# Patient Record
Sex: Female | Born: 1985
Health system: Southern US, Community
[De-identification: ages and names within clinical notes are randomized; demographics above are authoritative.]

## PROBLEM LIST (undated history)

## (undated) DIAGNOSIS — B009 Herpesviral infection, unspecified: Secondary | ICD-10-CM

## (undated) DIAGNOSIS — I10 Essential (primary) hypertension: Secondary | ICD-10-CM

## (undated) DIAGNOSIS — K8020 Calculus of gallbladder without cholecystitis without obstruction: Secondary | ICD-10-CM

## (undated) DIAGNOSIS — N390 Urinary tract infection, site not specified: Secondary | ICD-10-CM

## (undated) HISTORY — PX: OTHER SURGICAL HISTORY: SHX169

---
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
## 2004-01-01 ENCOUNTER — Emergency Department (HOSPITAL_COMMUNITY): Admission: EM | Admit: 2004-01-01 | Discharge: 2004-01-01 | Payer: Self-pay | Admitting: Emergency Medicine

## 2005-10-09 ENCOUNTER — Inpatient Hospital Stay (HOSPITAL_COMMUNITY): Admission: AD | Admit: 2005-10-09 | Discharge: 2005-10-09 | Payer: Self-pay | Admitting: Obstetrics & Gynecology

## 2006-01-05 ENCOUNTER — Ambulatory Visit (HOSPITAL_COMMUNITY): Admission: RE | Admit: 2006-01-05 | Discharge: 2006-01-05 | Payer: Self-pay | Admitting: Obstetrics and Gynecology

## 2006-03-23 ENCOUNTER — Emergency Department (HOSPITAL_COMMUNITY): Admission: EM | Admit: 2006-03-23 | Discharge: 2006-03-23 | Payer: Self-pay | Admitting: Emergency Medicine

## 2006-05-20 ENCOUNTER — Inpatient Hospital Stay (HOSPITAL_COMMUNITY): Admission: AD | Admit: 2006-05-20 | Discharge: 2006-05-20 | Payer: Self-pay | Admitting: Gynecology

## 2006-06-11 ENCOUNTER — Inpatient Hospital Stay (HOSPITAL_COMMUNITY): Admission: AD | Admit: 2006-06-11 | Discharge: 2006-06-14 | Payer: Self-pay | Admitting: Obstetrics

## 2008-01-28 ENCOUNTER — Emergency Department (HOSPITAL_BASED_OUTPATIENT_CLINIC_OR_DEPARTMENT_OTHER): Admission: EM | Admit: 2008-01-28 | Discharge: 2008-01-28 | Payer: Self-pay | Admitting: Emergency Medicine

## 2008-11-19 ENCOUNTER — Emergency Department (HOSPITAL_BASED_OUTPATIENT_CLINIC_OR_DEPARTMENT_OTHER): Admission: EM | Admit: 2008-11-19 | Discharge: 2008-11-19 | Payer: Self-pay | Admitting: Emergency Medicine

## 2008-11-26 ENCOUNTER — Ambulatory Visit (HOSPITAL_COMMUNITY): Admission: RE | Admit: 2008-11-26 | Discharge: 2008-11-26 | Payer: Self-pay | Admitting: Obstetrics

## 2008-11-30 ENCOUNTER — Emergency Department (HOSPITAL_BASED_OUTPATIENT_CLINIC_OR_DEPARTMENT_OTHER): Admission: EM | Admit: 2008-11-30 | Discharge: 2008-11-30 | Payer: Self-pay | Admitting: Emergency Medicine

## 2009-04-29 ENCOUNTER — Inpatient Hospital Stay (HOSPITAL_COMMUNITY): Admission: AD | Admit: 2009-04-29 | Discharge: 2009-05-01 | Payer: Self-pay | Admitting: Obstetrics

## 2009-06-17 ENCOUNTER — Ambulatory Visit (HOSPITAL_COMMUNITY): Admission: RE | Admit: 2009-06-17 | Discharge: 2009-06-17 | Payer: Self-pay | Admitting: Obstetrics

## 2010-06-04 ENCOUNTER — Emergency Department (HOSPITAL_BASED_OUTPATIENT_CLINIC_OR_DEPARTMENT_OTHER)
Admission: EM | Admit: 2010-06-04 | Discharge: 2010-06-04 | Payer: Self-pay | Source: Home / Self Care | Admitting: Emergency Medicine

## 2010-06-08 ENCOUNTER — Encounter: Payer: Self-pay | Admitting: Obstetrics

## 2010-06-09 LAB — DIFFERENTIAL
Basophils Relative: 0 % (ref 0–1)
Lymphocytes Relative: 42 % (ref 12–46)
Monocytes Absolute: 0.7 10*3/uL (ref 0.1–1.0)
Monocytes Relative: 9 % (ref 3–12)
Neutro Abs: 3.9 10*3/uL (ref 1.7–7.7)
Neutrophils Relative %: 47 % (ref 43–77)

## 2010-06-09 LAB — URINALYSIS, ROUTINE W REFLEX MICROSCOPIC
Bilirubin Urine: NEGATIVE
Hgb urine dipstick: NEGATIVE
Ketones, ur: NEGATIVE mg/dL
Protein, ur: NEGATIVE mg/dL
Urine Glucose, Fasting: NEGATIVE mg/dL
Urobilinogen, UA: 0.2 mg/dL (ref 0.0–1.0)

## 2010-06-09 LAB — COMPREHENSIVE METABOLIC PANEL
ALT: 15 U/L (ref 0–35)
AST: 24 U/L (ref 0–37)
CO2: 24 mEq/L (ref 19–32)
Calcium: 9.3 mg/dL (ref 8.4–10.5)
Chloride: 106 mEq/L (ref 96–112)
Creatinine, Ser: 0.9 mg/dL (ref 0.4–1.2)
GFR calc Af Amer: >60 mL/min (ref >60)
GFR calc non Af Amer: >60 mL/min (ref >60)
Glucose, Bld: 90 mg/dL (ref 70–99)
Sodium: 142 mEq/L (ref 135–145)
Total Bilirubin: 0.6 mg/dL (ref 0.3–1.2)

## 2010-06-09 LAB — CBC
HCT: 39.5 % (ref 36.0–46.0)
Hemoglobin: 13.5 g/dL (ref 12.0–15.0)
MCH: 28.6 pg (ref 26.0–34.0)
MCHC: 34.2 g/dL (ref 30.0–36.0)
RBC: 4.72 MIL/uL (ref 3.87–5.11)

## 2010-06-09 LAB — LIPASE, BLOOD: Lipase: 85 U/L (ref 23–300)

## 2010-08-19 LAB — CBC
HCT: 28.1 % — ABNORMAL LOW (ref 36.0–46.0)
HCT: 32.3 % — ABNORMAL LOW (ref 36.0–46.0)
Hemoglobin: 9.1 g/dL — ABNORMAL LOW (ref 12.0–15.0)
MCHC: 32.3 g/dL (ref 30.0–36.0)
MCV: 77.2 fL — ABNORMAL LOW (ref 78.0–100.0)
MCV: 77.4 fL — ABNORMAL LOW (ref 78.0–100.0)
Platelets: 285 10*3/uL (ref 150–400)
RBC: 3.63 MIL/uL — ABNORMAL LOW (ref 3.87–5.11)
RDW: 17.5 % — ABNORMAL HIGH (ref 11.5–15.5)
WBC: 12.9 10*3/uL — ABNORMAL HIGH (ref 4.0–10.5)

## 2010-08-19 LAB — RPR: RPR Ser Ql: NONREACTIVE

## 2010-08-19 LAB — URINE CULTURE: Colony Count: NO GROWTH

## 2011-10-29 ENCOUNTER — Emergency Department (HOSPITAL_COMMUNITY)
Admission: EM | Admit: 2011-10-29 | Discharge: 2011-10-29 | Disposition: A | Discharge status: Home / Self Care | Payer: Self-pay | Attending: Emergency Medicine | Admitting: Emergency Medicine

## 2011-10-29 ENCOUNTER — Emergency Department (HOSPITAL_COMMUNITY): Payer: Self-pay

## 2011-10-29 ENCOUNTER — Encounter (HOSPITAL_COMMUNITY): Payer: Self-pay

## 2011-10-29 DIAGNOSIS — R109 Unspecified abdominal pain: Secondary | ICD-10-CM | POA: Insufficient documentation

## 2011-10-29 DIAGNOSIS — N390 Urinary tract infection, site not specified: Secondary | ICD-10-CM | POA: Insufficient documentation

## 2011-10-29 DIAGNOSIS — K8050 Calculus of bile duct without cholangitis or cholecystitis without obstruction: Secondary | ICD-10-CM

## 2011-10-29 DIAGNOSIS — R112 Nausea with vomiting, unspecified: Secondary | ICD-10-CM | POA: Insufficient documentation

## 2011-10-29 DIAGNOSIS — R10819 Abdominal tenderness, unspecified site: Secondary | ICD-10-CM | POA: Insufficient documentation

## 2011-10-29 DIAGNOSIS — K8020 Calculus of gallbladder without cholecystitis without obstruction: Secondary | ICD-10-CM | POA: Insufficient documentation

## 2011-10-29 HISTORY — DX: Calculus of gallbladder without cholecystitis without obstruction: K80.20

## 2011-10-29 LAB — LIPASE, BLOOD: Lipase: 20 U/L (ref 11–59)

## 2011-10-29 LAB — CBC
HCT: 46.2 % — ABNORMAL HIGH (ref 36.0–46.0)
MCH: 30.8 pg (ref 26.0–34.0)
MCV: 88.8 fL (ref 78.0–100.0)
RDW: 13 % (ref 11.5–15.5)
WBC: 13.7 10*3/uL — ABNORMAL HIGH (ref 4.0–10.5)

## 2011-10-29 LAB — URINALYSIS, ROUTINE W REFLEX MICROSCOPIC
Ketones, ur: NEGATIVE mg/dL
Nitrite: NEGATIVE
Protein, ur: NEGATIVE mg/dL
pH: 6.5 (ref 5.0–8.0)

## 2011-10-29 LAB — URINE MICROSCOPIC-ADD ON

## 2011-10-29 LAB — GC/CHLAMYDIA PROBE AMP, GENITAL
Chlamydia, DNA Probe: NEGATIVE
GC Probe Amp, Genital: NEGATIVE

## 2011-10-29 LAB — COMPREHENSIVE METABOLIC PANEL
Albumin: 4.1 g/dL (ref 3.5–5.2)
BUN: 7 mg/dL (ref 6–23)
Calcium: 9.6 mg/dL (ref 8.4–10.5)
Chloride: 100 mEq/L (ref 96–112)
Creatinine, Ser: 0.7 mg/dL (ref 0.50–1.10)
GFR calc non Af Amer: >90 mL/min (ref >90)
Total Bilirubin: 0.5 mg/dL (ref 0.3–1.2)

## 2011-10-29 MED ORDER — DEXTROSE 5 % IV SOLN
1.0000 g | Freq: Once | INTRAVENOUS | Status: AC
  Administered 2011-10-29: 10:00:00 via INTRAVENOUS
  Filled 2011-10-29: qty 10

## 2011-10-29 MED ORDER — OXYCODONE-ACETAMINOPHEN 5-325 MG PO TABS: 1.0000 | ORAL_TABLET | Freq: Four times a day (QID) | ORAL | Status: DC | PRN

## 2011-10-29 MED ORDER — PROMETHAZINE HCL 25 MG/ML IJ SOLN
25.0000 mg | Freq: Once | INTRAMUSCULAR | Status: AC
  Administered 2011-10-29: 25 mg via INTRAMUSCULAR
  Filled 2011-10-29: qty 1

## 2011-10-29 MED ORDER — AZITHROMYCIN 250 MG PO TABS
1000.0000 mg | ORAL_TABLET | Freq: Once | ORAL | Status: AC
  Administered 2011-10-29: 1000 mg via ORAL
  Filled 2011-10-29: qty 4

## 2011-10-29 MED ORDER — METOCLOPRAMIDE HCL 5 MG/ML IJ SOLN
10.0000 mg | Freq: Once | INTRAMUSCULAR | Status: AC
  Administered 2011-10-29: 10 mg via INTRAVENOUS
  Filled 2011-10-29: qty 2

## 2011-10-29 MED ORDER — MORPHINE SULFATE 4 MG/ML IJ SOLN
4.0000 mg | Freq: Once | INTRAMUSCULAR | Status: AC
  Administered 2011-10-29: 4 mg via INTRAVENOUS
  Filled 2011-10-29: qty 1

## 2011-10-29 MED ORDER — ONDANSETRON HCL 4 MG/2ML IJ SOLN
4.0000 mg | Freq: Once | INTRAMUSCULAR | Status: AC
  Administered 2011-10-29: 4 mg via INTRAVENOUS
  Filled 2011-10-29: qty 2

## 2011-10-29 MED ORDER — FENTANYL CITRATE 0.05 MG/ML IJ SOLN
50.0000 ug | Freq: Once | INTRAMUSCULAR | Status: AC
  Administered 2011-10-29: 50 ug via INTRAVENOUS
  Filled 2011-10-29: qty 2

## 2011-10-29 MED ORDER — ONDANSETRON 8 MG PO TBDP: 8.0000 mg | ORAL_TABLET | Freq: Three times a day (TID) | ORAL | Status: DC | PRN

## 2011-10-29 MED ORDER — HYDROMORPHONE HCL PF 1 MG/ML IJ SOLN
1.0000 mg | Freq: Once | INTRAMUSCULAR | Status: AC
  Administered 2011-10-29: 1 mg via INTRAVENOUS
  Filled 2011-10-29: qty 1

## 2011-10-29 MED ORDER — IOHEXOL 300 MG/ML  SOLN
100.0000 mL | Freq: Once | INTRAMUSCULAR | Status: AC | PRN
  Administered 2011-10-29: 100 mL via INTRAVENOUS

## 2011-10-29 MED ORDER — SODIUM CHLORIDE 0.9 % IV SOLN
Freq: Once | INTRAVENOUS | Status: AC
  Administered 2011-10-29: 04:00:00 via INTRAVENOUS

## 2011-10-29 NOTE — ED Provider Notes (Signed)
History     CSN: [id number]  Arrival date & time 10/29/11  4098   First MD Initiated Contact with Patient 10/29/11 0434      Chief Complaint  Patient presents with  . Abdominal Pain   HPI  History provided by the patient. Patient is a 26 year old female with history of obesity and gallstones who presents with complaints of worsening right-sided abdominal pain for the past 3 days. Patient states that pain has been constant with some waxing and waning. At times patient states pain feels similar to prior right upper quadrant pains related to gallstones but at times feels much different. Pain is described as sharp and severe. Patient states that she was diagnosed with gallstones earlier in the year with a scheduled surgery at one time. She will occasionally have some pains off and on over the past few months. Pain usually resolves on its own but the past 2 days has been persistent. Symptoms are associated with some decreased appetite and nausea. Patient denies any fever, chills, sweats, vomiting    Past Medical History  Diagnosis Date  . Gallstones     History reviewed. No pertinent past surgical history.  History reviewed. No pertinent family history.  History  Substance Use Topics  . Smoking status: Current Everyday Smoker -- 0.5 packs/day  . Smokeless tobacco: Not on file  . Alcohol Use: Yes     social    OB History    Grav Para Term Preterm Abortions TAB SAB Ect Mult Living   2 2              Review of Systems  Constitutional: Positive for appetite change. Negative for fever and chills.  Gastrointestinal: Positive for abdominal pain. Negative for nausea, vomiting, diarrhea and constipation.  Genitourinary: Positive for dysuria. Negative for hematuria, flank pain, vaginal bleeding and vaginal discharge.  Skin: Negative for rash.    Allergies  Review of patient's allergies indicates no known allergies.  Home Medications   Current Outpatient Rx  Name Route Sig  Dispense Refill  . IBUPROFEN 200 MG PO TABS Oral Take 400 mg by mouth every 6 (six) hours as needed. Pain    . OXYCODONE-ACETAMINOPHEN 5-325 MG PO TABS Oral Take 1 tablet by mouth every 4 (four) hours as needed. Pain      BP 130/91  Pulse 82  Temp 98.3 F (36.8 C)  Resp 18  Ht 5\' 7"  (1.702 m)  Wt 265 lb (120.203 kg)  BMI 41.50 kg/m2  SpO2 98%  Physical Exam  Nursing note and vitals reviewed. Constitutional: She is oriented to person, place, and time. She appears well-developed and well-nourished. No distress.  HENT:  Head: Normocephalic and atraumatic.  Cardiovascular: Normal rate and regular rhythm.   Pulmonary/Chest: Effort normal and breath sounds normal. No respiratory distress. She has no wheezes. She has no rales.  Abdominal: Soft. There is tenderness in the right upper quadrant and right lower quadrant. There is no rebound and no guarding.       Obese.  Genitourinary: Uterus is not tender. Cervix exhibits no motion tenderness and no friability. Right adnexum displays no mass and no tenderness. Left adnexum displays no mass and no tenderness.       Chaperone was present. Mild cervical discharge. IUD string in place. No erythema or bleeding.  Neurological: She is alert and oriented to person, place, and time.  Skin: Skin is warm and dry. No rash noted.  Psychiatric: She has a normal mood and affect.  Her behavior is normal.    ED Course  Procedures  Results for orders placed during the hospital encounter of 10/29/11  CBC      Component Value Range   WBC 13.7 (*) 4.0 - 10.5 K/uL   RBC 5.20 (*) 3.87 - 5.11 MIL/uL   Hemoglobin 16.0 (*) 12.0 - 15.0 g/dL   HCT 40.9 (*) 81.1 - 91.4 %   MCV 88.8  78.0 - 100.0 fL   MCH 30.8  26.0 - 34.0 pg   MCHC 34.6  30.0 - 36.0 g/dL   RDW 78.2  95.6 - 21.3 %   Platelets 279  150 - 400 K/uL  COMPREHENSIVE METABOLIC PANEL      Component Value Range   Sodium 136  135 - 145 mEq/L   Potassium 3.7  3.5 - 5.1 mEq/L   Chloride 100  96 - 112  mEq/L   CO2 23  19 - 32 mEq/L   Glucose, Bld 89  70 - 99 mg/dL   BUN 7  6 - 23 mg/dL   Creatinine, Ser 0.86  0.50 - 1.10 mg/dL   Calcium 9.6  8.4 - 57.8 mg/dL   Total Protein 8.1  6.0 - 8.3 g/dL   Albumin 4.1  3.5 - 5.2 g/dL   AST 22  0 - 37 U/L   ALT 32  0 - 35 U/L   Alkaline Phosphatase 62  39 - 117 U/L   Total Bilirubin 0.5  0.3 - 1.2 mg/dL   GFR calc non Af Amer >90  >90 mL/min   GFR calc Af Amer >90  >90 mL/min  LIPASE, BLOOD      Component Value Range   Lipase 20  11 - 59 U/L  URINALYSIS, ROUTINE W REFLEX MICROSCOPIC      Component Value Range   Color, Urine YELLOW  YELLOW   APPearance CLOUDY (*) CLEAR   Specific Gravity, Urine 1.018  1.005 - 1.030   pH 6.5  5.0 - 8.0   Glucose, UA NEGATIVE  NEGATIVE mg/dL   Hgb urine dipstick LARGE (*) NEGATIVE   Bilirubin Urine NEGATIVE  NEGATIVE   Ketones, ur NEGATIVE  NEGATIVE mg/dL   Protein, ur NEGATIVE  NEGATIVE mg/dL   Urobilinogen, UA 1.0  0.0 - 1.0 mg/dL   Nitrite NEGATIVE  NEGATIVE   Leukocytes, UA LARGE (*) NEGATIVE  PREGNANCY, URINE      Component Value Range   Preg Test, Ur NEGATIVE  NEGATIVE  URINE MICROSCOPIC-ADD ON      Component Value Range   Squamous Epithelial / LPF MANY (*) RARE   WBC, UA 7-10  <3 WBC/hpf   RBC / HPF 0-2  <3 RBC/hpf   Bacteria, UA FEW (*) RARE       No results found.   No diagnosis found.    MDM  Patient seen and evaluated. Patient no acute distress.    UA has signs of UTI.    Pt discussed in sign out with Dr. Brooke Dare  He will follow CT results.  Angus Seller, [place] 10/29/11 [phone]

## 2011-10-29 NOTE — ED Notes (Signed)
Pt reports RUQ abdominal pain and right flank pain, onset 4 days ago. Pt states that she has hx of kidney stones--- first diagnosed 2 years ago and has had recent recurrence last February--- states that she was supposed to get surgery for it but she did not push through with it because her insurance "ran out". Pt states that symptoms usually disappear in 12 hours but this time, pain is constant and is persistent. Has some nausea, denies vomiting or diarrhea.

## 2011-10-29 NOTE — ED Notes (Signed)
Pt states right side abdominal pain sharp radiating into chest since Monday pt states increase in severity tonight. Pt also c/o nausea denies vomiting. Pt tearful and anxious.

## 2011-10-29 NOTE — ED Provider Notes (Signed)
Medical screening examination/treatment/procedure(s) were performed by non-physician practitioner and as supervising physician I was immediately available for consultation/collaboration.   Sunnie Nielsen, MD 10/29/11 [phone]

## 2011-10-29 NOTE — ED Provider Notes (Addendum)
Assumed care from Dr Dierdre Highman and Ivonne Alexa Golebiewski. Pelvic exam unremarkable. Awaiting CT ap.  If negative will dc home with sx control and provide surgery referral.  Dayton Bailiff, MD 10/29/11 0804  CT negative will order RUQ [place] per CT read recs and still with RUQ pain and leukocytosis  Dayton Bailiff, MD 10/29/11 [phone]

## 2011-10-29 NOTE — ED Notes (Signed)
Patient transported to CT 

## 2011-10-29 NOTE — Discharge Instructions (Signed)
Gallbladder Disease  Gallbladder disease (cholecystitis) is an inflammation of your gallbladder. It is usually caused by a build-up of stones (gallstones) or sludge (cholelithiasis) in your gallbladder. The gallbladder is not an essential organ. It is located slightly to the right of center in the belly (abdomen), behind the liver. It stores bile made in the liver. Bile aids in digestion of fats. Gallbladder disease may result in nausea (feeling sick to your stomach), abdominal pain, and jaundice. In severe cases, emergency surgery may be required.  The most common type of gallbladder disease is gallstones. They begin as small crystals and slowly grow into stones. Gallstone pain occurs when the bile duct has spasms. The spasms are caused by the stone passing out of the duct. The stone is trying to pass at the same time bile is passing into the small bowel for digestion. The pain usually begins suddenly. It may persist from several minutes to several hours. Infection can occur. Infection can add to discomfort and severity of an acute attack. The pain may be made worse by breathing deeply or by being jarred. There may be fever and tenderness to the touch. In some cases, when gallstones do not move into the bile duct, people have no pain or symptoms. These are called "silent" gallstones.  Women are three times more likely to develop gallstones than men. Women who have had several pregnancies are more likely to have gallbladder disease. Physicians sometimes advise removing diseased gallbladders before future pregnancies. Other factors that increase the risk of gallbladder disease are obesity, diets heavy in fried foods and dairy products, increasing age, prolonged use of medications containing female hormones, and heredity.  HOME CARE INSTRUCTIONS    If your physician prescribed an antibiotic, take as directed.   Only take over-the-counter or prescription medicines for pain, discomfort, or fever as directed by your  caregiver.   Follow a low fat diet until seen again. (Fat causes the gallbladder to contract.)   Follow-up as instructed. Attacks are almost always recurrent and surgery is usually required for permanent treatment.  SEEK IMMEDIATE MEDICAL CARE IF:    Pain is increasing and not controlled by medications.   The pain moves to another part of your abdomen or to your back. (Right sided pain can be appendicitis and left sided pain in adults can be diverticulitis).   You have a fever.   You develop nausea and vomiting.  Document Released: 05/04/2005 Document Revised: 04/23/2011 Document Reviewed: 03/20/2011  [employer] Patient Information 2012 [employer].

## 2011-10-29 NOTE — ED Notes (Signed)
Pt. Requesting pain meds at this time. RN made aware.

## 2011-10-31 ENCOUNTER — Inpatient Hospital Stay (HOSPITAL_COMMUNITY): Payer: Medicaid Other | Admitting: Anesthesiology

## 2011-10-31 ENCOUNTER — Inpatient Hospital Stay (HOSPITAL_COMMUNITY): Payer: Medicaid Other

## 2011-10-31 ENCOUNTER — Encounter (HOSPITAL_COMMUNITY): Payer: Self-pay | Admitting: Anesthesiology

## 2011-10-31 ENCOUNTER — Encounter (HOSPITAL_COMMUNITY): Payer: Self-pay | Admitting: Emergency Medicine

## 2011-10-31 ENCOUNTER — Encounter (HOSPITAL_COMMUNITY): Admission: EM | Disposition: A | Discharge status: Home / Self Care | Payer: Self-pay | Source: Home / Self Care

## 2011-10-31 ENCOUNTER — Inpatient Hospital Stay (HOSPITAL_COMMUNITY)
Admission: EM | Admit: 2011-10-31 | Discharge: 2011-11-02 | DRG: 418 | Disposition: A | Discharge status: Home / Self Care | Payer: Medicaid Other | Attending: Surgery | Admitting: Surgery

## 2011-10-31 DIAGNOSIS — K8020 Calculus of gallbladder without cholecystitis without obstruction: Secondary | ICD-10-CM

## 2011-10-31 DIAGNOSIS — K8066 Calculus of gallbladder and bile duct with acute and chronic cholecystitis without obstruction: Secondary | ICD-10-CM

## 2011-10-31 DIAGNOSIS — Z79899 Other long term (current) drug therapy: Secondary | ICD-10-CM

## 2011-10-31 DIAGNOSIS — E669 Obesity, unspecified: Secondary | ICD-10-CM | POA: Diagnosis present

## 2011-10-31 DIAGNOSIS — N390 Urinary tract infection, site not specified: Secondary | ICD-10-CM | POA: Diagnosis present

## 2011-10-31 DIAGNOSIS — K8000 Calculus of gallbladder with acute cholecystitis without obstruction: Principal | ICD-10-CM | POA: Diagnosis present

## 2011-10-31 HISTORY — DX: Urinary tract infection, site not specified: N39.0

## 2011-10-31 HISTORY — PX: CHOLECYSTECTOMY: SHX55

## 2011-10-31 LAB — DIFFERENTIAL
Basophils Absolute: 0 10*3/uL (ref 0.0–0.1)
Eosinophils Relative: 2 % (ref 0–5)
Lymphocytes Relative: 24 % (ref 12–46)
Neutrophils Relative %: 65 % (ref 43–77)

## 2011-10-31 LAB — CBC
MCV: 88.5 fL (ref 78.0–100.0)
Platelets: 264 10*3/uL (ref 150–400)
RBC: 4.85 MIL/uL (ref 3.87–5.11)
RDW: 12.8 % (ref 11.5–15.5)

## 2011-10-31 LAB — BASIC METABOLIC PANEL
CO2: 27 mEq/L (ref 19–32)
Calcium: 9.1 mg/dL (ref 8.4–10.5)
GFR calc non Af Amer: >90 mL/min (ref >90)
Potassium: 3.9 mEq/L (ref 3.5–5.1)
Sodium: 138 mEq/L (ref 135–145)

## 2011-10-31 LAB — LIPASE, BLOOD: Lipase: 22 U/L (ref 11–59)

## 2011-10-31 LAB — HEPATIC FUNCTION PANEL
Albumin: 3.9 g/dL (ref 3.5–5.2)
Alkaline Phosphatase: 59 U/L (ref 39–117)
Total Protein: 7.6 g/dL (ref 6.0–8.3)

## 2011-10-31 SURGERY — LAPAROSCOPIC CHOLECYSTECTOMY WITH INTRAOPERATIVE CHOLANGIOGRAM
Anesthesia: General | Site: Abdomen | Wound class: Contaminated

## 2011-10-31 MED ORDER — DEXTROSE 5 % IV SOLN
2.0000 g | Freq: Once | INTRAVENOUS | Status: AC
  Administered 2011-10-31: 2 g via INTRAVENOUS
  Filled 2011-10-31: qty 2

## 2011-10-31 MED ORDER — LACTATED RINGERS IV SOLN
INTRAVENOUS | Status: DC | PRN
  Administered 2011-10-31: 11:00:00 via INTRAVENOUS

## 2011-10-31 MED ORDER — CIPROFLOXACIN HCL 500 MG PO TABS
500.0000 mg | ORAL_TABLET | Freq: Two times a day (BID) | ORAL | Status: DC
  Administered 2011-10-31 – 2011-11-01 (×2): 500 mg via ORAL
  Filled 2011-10-31 (×4): qty 1

## 2011-10-31 MED ORDER — LIDOCAINE HCL (CARDIAC) 20 MG/ML IV SOLN
INTRAVENOUS | Status: DC | PRN
  Administered 2011-10-31: 100 mg via INTRAVENOUS

## 2011-10-31 MED ORDER — SODIUM CHLORIDE 0.9 % IV SOLN
INTRAVENOUS | Status: DC | PRN
  Administered 2011-10-31: 13:00:00

## 2011-10-31 MED ORDER — ALBUTEROL SULFATE HFA 108 (90 BASE) MCG/ACT IN AERS
INHALATION_SPRAY | RESPIRATORY_TRACT | Status: DC | PRN
  Administered 2011-10-31: 4 via RESPIRATORY_TRACT

## 2011-10-31 MED ORDER — ROCURONIUM BROMIDE 100 MG/10ML IV SOLN
INTRAVENOUS | Status: DC | PRN
  Administered 2011-10-31: 50 mg via INTRAVENOUS

## 2011-10-31 MED ORDER — ONDANSETRON HCL 4 MG/2ML IJ SOLN: 4.0000 mg | Freq: Four times a day (QID) | INTRAMUSCULAR | Status: DC | PRN

## 2011-10-31 MED ORDER — GLYCOPYRROLATE 0.2 MG/ML IJ SOLN
INTRAMUSCULAR | Status: DC | PRN
  Administered 2011-10-31: .8 mg via INTRAVENOUS

## 2011-10-31 MED ORDER — HYDROMORPHONE HCL PF 1 MG/ML IJ SOLN
1.0000 mg | Freq: Once | INTRAMUSCULAR | Status: AC
  Administered 2011-10-31: 1 mg via INTRAVENOUS
  Filled 2011-10-31: qty 1

## 2011-10-31 MED ORDER — SODIUM CHLORIDE 0.9 % IV SOLN
INTRAVENOUS | Status: DC
  Administered 2011-10-31 – 2011-11-01 (×2): via INTRAVENOUS

## 2011-10-31 MED ORDER — HYDROMORPHONE HCL PF 1 MG/ML IJ SOLN
0.2500 mg | INTRAMUSCULAR | Status: DC | PRN
  Administered 2011-10-31 (×4): 0.5 mg via INTRAVENOUS

## 2011-10-31 MED ORDER — LORAZEPAM 2 MG/ML IJ SOLN: 1.0000 mg | Freq: Once | INTRAMUSCULAR | Status: DC | PRN

## 2011-10-31 MED ORDER — FENTANYL CITRATE 0.05 MG/ML IJ SOLN
INTRAMUSCULAR | Status: DC | PRN
  Administered 2011-10-31 (×5): 50 ug via INTRAVENOUS

## 2011-10-31 MED ORDER — ONDANSETRON HCL 4 MG/2ML IJ SOLN
INTRAMUSCULAR | Status: DC | PRN
  Administered 2011-10-31: 4 mg via INTRAVENOUS

## 2011-10-31 MED ORDER — FENTANYL CITRATE 0.05 MG/ML IJ SOLN: 50.0000 ug | INTRAMUSCULAR | Status: DC | PRN

## 2011-10-31 MED ORDER — MORPHINE SULFATE 2 MG/ML IJ SOLN
2.0000 mg | INTRAMUSCULAR | Status: DC | PRN
  Administered 2011-10-31 – 2011-11-01 (×6): 2 mg via INTRAVENOUS
  Filled 2011-10-31 (×6): qty 1

## 2011-10-31 MED ORDER — NEOSTIGMINE METHYLSULFATE 1 MG/ML IJ SOLN
INTRAMUSCULAR | Status: DC | PRN
  Administered 2011-10-31: 5 mg via INTRAVENOUS

## 2011-10-31 MED ORDER — PHENAZOPYRIDINE HCL 100 MG PO TABS
100.0000 mg | ORAL_TABLET | Freq: Three times a day (TID) | ORAL | Status: DC
  Administered 2011-10-31 – 2011-11-02 (×5): 100 mg via ORAL
  Filled 2011-10-31 (×8): qty 1

## 2011-10-31 MED ORDER — DEXTROSE 5 % IV SOLN
2.0000 g | Freq: Four times a day (QID) | INTRAVENOUS | Status: DC
  Filled 2011-10-31 (×3): qty 2

## 2011-10-31 MED ORDER — ONDANSETRON HCL 4 MG/2ML IJ SOLN
4.0000 mg | Freq: Once | INTRAMUSCULAR | Status: AC
  Administered 2011-10-31: 4 mg via INTRAVENOUS
  Filled 2011-10-31: qty 2

## 2011-10-31 MED ORDER — SODIUM CHLORIDE 0.9 % IR SOLN
Status: DC | PRN
  Administered 2011-10-31: 1000 mL

## 2011-10-31 MED ORDER — PHENYLEPHRINE HCL 10 MG/ML IJ SOLN
INTRAMUSCULAR | Status: DC | PRN
  Administered 2011-10-31: 80 ug via INTRAVENOUS

## 2011-10-31 MED ORDER — SODIUM CHLORIDE 0.9 % IV SOLN
INTRAVENOUS | Status: DC
  Administered 2011-10-31: 125 mL/h via INTRAVENOUS

## 2011-10-31 MED ORDER — ENOXAPARIN SODIUM 40 MG/0.4ML ~~LOC~~ SOLN
40.0000 mg | SUBCUTANEOUS | Status: DC
  Administered 2011-11-01: 40 mg via SUBCUTANEOUS
  Filled 2011-10-31 (×3): qty 0.4

## 2011-10-31 MED ORDER — SODIUM CHLORIDE 0.9 % IV SOLN: INTRAVENOUS | Status: DC

## 2011-10-31 MED ORDER — MIDAZOLAM HCL 2 MG/2ML IJ SOLN: 1.0000 mg | INTRAMUSCULAR | Status: DC | PRN

## 2011-10-31 MED ORDER — HYDROMORPHONE HCL PF 1 MG/ML IJ SOLN
INTRAMUSCULAR | Status: AC
  Filled 2011-10-31: qty 1

## 2011-10-31 MED ORDER — BUPIVACAINE-EPINEPHRINE 0.25% -1:200000 IJ SOLN
INTRAMUSCULAR | Status: DC | PRN
  Administered 2011-10-31: 10 mL

## 2011-10-31 MED ORDER — PROPOFOL 10 MG/ML IV EMUL
INTRAVENOUS | Status: DC | PRN
  Administered 2011-10-31: 200 mg via INTRAVENOUS

## 2011-10-31 MED ORDER — MIDAZOLAM HCL 5 MG/5ML IJ SOLN
INTRAMUSCULAR | Status: DC | PRN
  Administered 2011-10-31: 2 mg via INTRAVENOUS

## 2011-10-31 SURGICAL SUPPLY — 41 items
APPLIER CLIP 5 13 M/L LIGAMAX5 (MISCELLANEOUS) ×4
APPLIER CLIP ROT 10 11.4 M/L (STAPLE) ×2
BENZOIN TINCTURE PRP APPL 2/3 (GAUZE/BANDAGES/DRESSINGS) ×2 IMPLANT
BLADE SURG ROTATE 9660 (MISCELLANEOUS) IMPLANT
CANISTER SUCTION 2500CC (MISCELLANEOUS) ×2 IMPLANT
CHLORAPREP W/TINT 26ML (MISCELLANEOUS) ×2 IMPLANT
CLIP APPLIE 5 13 M/L LIGAMAX5 (MISCELLANEOUS) ×2 IMPLANT
CLIP APPLIE ROT 10 11.4 M/L (STAPLE) ×1 IMPLANT
CLOTH BEACON ORANGE TIMEOUT ST (SAFETY) ×2 IMPLANT
COVER MAYO STAND STRL (DRAPES) ×2 IMPLANT
COVER SURGICAL LIGHT HANDLE (MISCELLANEOUS) ×2 IMPLANT
DECANTER SPIKE VIAL GLASS SM (MISCELLANEOUS) ×4 IMPLANT
DRAPE C-ARM 42X72 X-RAY (DRAPES) ×2 IMPLANT
DRAPE UTILITY 15X26 W/TAPE STR (DRAPE) ×4 IMPLANT
DRSG TEGADERM 4X4.75 (GAUZE/BANDAGES/DRESSINGS) ×8 IMPLANT
ELECT REM PT RETURN 9FT ADLT (ELECTROSURGICAL) ×2
ELECTRODE REM PT RTRN 9FT ADLT (ELECTROSURGICAL) ×1 IMPLANT
FILTER SMOKE EVAC LAPAROSHD (FILTER) ×2 IMPLANT
GAUZE SPONGE 2X2 8PLY STRL LF (GAUZE/BANDAGES/DRESSINGS) ×2 IMPLANT
GLOVE BIO SURGEON STRL SZ7 (GLOVE) ×2 IMPLANT
GLOVE BIOGEL PI IND STRL 7.5 (GLOVE) ×1 IMPLANT
GLOVE BIOGEL PI INDICATOR 7.5 (GLOVE) ×1
GOWN STRL NON-REIN LRG LVL3 (GOWN DISPOSABLE) ×8 IMPLANT
KIT BASIN OR (CUSTOM PROCEDURE TRAY) ×2 IMPLANT
KIT ROOM TURNOVER OR (KITS) ×2 IMPLANT
NS IRRIG 1000ML POUR BTL (IV SOLUTION) ×2 IMPLANT
PAD ARMBOARD 7.5X6 YLW CONV (MISCELLANEOUS) ×2 IMPLANT
POUCH SPECIMEN RETRIEVAL 10MM (ENDOMECHANICALS) ×2 IMPLANT
SCISSORS LAP 5X35 DISP (ENDOMECHANICALS) IMPLANT
SET CHOLANGIOGRAPH 5 50 .035 (SET/KITS/TRAYS/PACK) ×2 IMPLANT
SET IRRIG TUBING LAPAROSCOPIC (IRRIGATION / IRRIGATOR) ×2 IMPLANT
SLEEVE ENDOPATH XCEL 5M (ENDOMECHANICALS) ×2 IMPLANT
SPECIMEN JAR SMALL (MISCELLANEOUS) ×2 IMPLANT
SPONGE GAUZE 2X2 STER 10/PKG (GAUZE/BANDAGES/DRESSINGS) ×2
SUT MNCRL AB 4-0 PS2 18 (SUTURE) ×2 IMPLANT
TOWEL OR 17X24 6PK STRL BLUE (TOWEL DISPOSABLE) ×2 IMPLANT
TOWEL OR 17X26 10 PK STRL BLUE (TOWEL DISPOSABLE) ×2 IMPLANT
TRAY LAPAROSCOPIC (CUSTOM PROCEDURE TRAY) ×2 IMPLANT
TROCAR XCEL BLUNT TIP 100MML (ENDOMECHANICALS) ×2 IMPLANT
TROCAR XCEL NON-BLD 11X100MML (ENDOMECHANICALS) ×2 IMPLANT
TROCAR XCEL NON-BLD 5MMX100MML (ENDOMECHANICALS) ×2 IMPLANT

## 2011-10-31 NOTE — Anesthesia Postprocedure Evaluation (Signed)
  Anesthesia Post-op Note  Patient: Amanda Nunez  Procedure(s) Performed: Procedure(s) (LRB): LAPAROSCOPIC CHOLECYSTECTOMY WITH INTRAOPERATIVE CHOLANGIOGRAM (N/A)  Patient Location: PACU  Anesthesia Type: General  Level of Consciousness: awake  Airway and Oxygen Therapy: Patient Spontanous Breathing  Post-op Pain: mild  Post-op Assessment: Post-op Vital signs reviewed, Patient's Cardiovascular Status Stable, Respiratory Function Stable, Patent Airway, No signs of Nausea or vomiting and Pain level controlled  Post-op Vital Signs: stable  Complications: No apparent anesthesia complications

## 2011-10-31 NOTE — Op Note (Signed)
Laparoscopic Cholecystectomy with IOC Procedure Note  Indications: This patient presents with symptomatic gallbladder disease and will undergo laparoscopic cholecystectomy.  Pre-operative Diagnosis: Calculus of gallbladder with acute cholecystitis, without mention of obstruction  Post-operative Diagnosis: Same  Surgeon: Mele Sylvester K.   Assistants: none  Anesthesia: General endotracheal anesthesia  ASA Class: 2E  Procedure Details  The patient was seen again in the Holding Room. The risks, benefits, complications, treatment options, and expected outcomes were discussed with the patient. The possibilities of reaction to medication, pulmonary aspiration, perforation of viscus, bleeding, recurrent infection, finding a normal gallbladder, the need for additional procedures, failure to diagnose a condition, the possible need to convert to an open procedure, and creating a complication requiring transfusion or operation were discussed with the patient. The likelihood of improving the patient's symptoms with return to their baseline status is good.  The patient and/or family concurred with the proposed plan, giving informed consent. The site of surgery properly noted. The patient was taken to Operating Room, identified as Amanda Nunez and the procedure verified as Laparoscopic Cholecystectomy with Intraoperative Cholangiogram. A Time Out was held and the above information confirmed.  Prior to the induction of general anesthesia, antibiotic prophylaxis was administered. General endotracheal anesthesia was then administered and tolerated well. After the induction, the abdomen was prepped with Chloraprep and draped in the sterile fashion. The patient was positioned in the supine position.  Local anesthetic agent was injected into the skin near the umbilicus and an incision made. We dissected down to the abdominal fascia with blunt dissection.  The fascia was incised vertically and we entered the  peritoneal cavity bluntly.  A pursestring suture of 0-Vicryl was placed around the fascial opening.  The Hasson cannula was inserted and secured with the stay suture.  Pneumoperitoneum was then created with CO2 and tolerated well without any adverse changes in the patient's vital signs. An 11-mm port was placed in the subxiphoid position.  Two 5-mm ports were placed in the right upper quadrant. All skin incisions were infiltrated with a local anesthetic agent before making the incision and placing the trocars.   We positioned the patient in reverse Trendelenburg, tilted slightly to the patient's left.  The gallbladder was identified, and was noted to be very edematous and thickened.  We decompressed the gallbladder with the suction device, then  the fundus was grasped and retracted cephalad. Adhesions were lysed bluntly and with the electrocautery where indicated, taking care not to injure any adjacent organs or viscus. The infundibulum was grasped and retracted laterally, exposing the peritoneum overlying the triangle of Calot. This was then divided and exposed in a blunt fashion. A critical view of the cystic duct and cystic artery was obtained.  The cystic duct was clearly identified and bluntly dissected circumferentially. The cystic duct was ligated with a clip distally.   An incision was made in the cystic duct and the [facility] cholangiogram catheter introduced. The catheter was secured using a clip. A cholangiogram was then obtained which showed good visualization of the distal and proximal biliary tree with no sign of filling defects or obstruction.  Contrast flowed easily into the duodenum. The catheter was then removed.   The cystic duct was then ligated with clips and divided. The cystic artery was identified, dissected free, ligated with clips and divided as well.   The gallbladder was dissected from the liver bed in retrograde fashion with the electrocautery.  This was quite difficult due to the size  of the gallbladder  and the thickened wall. The gallbladder was removed and placed in an Endocatch sac. The liver bed was irrigated and inspected. Hemostasis was achieved with the electrocautery. Copious irrigation was utilized and was repeatedly aspirated until clear.  The gallbladder and Endocatch sac were then removed through the umbilical port site.  The pursestring suture was used to close the umbilical fascia.    We again inspected the right upper quadrant for hemostasis.  Pneumoperitoneum was released as we removed the trocars.  4-0 Monocryl was used to close the skin.   Benzoin, steri-strips, and clean dressings were applied. The patient was then extubated and brought to the recovery room in stable condition. Instrument, sponge, and needle counts were correct at closure and at the conclusion of the case.   Findings: Cholecystitis with Cholelithiasis  Estimated Blood Loss: less than 50 mL         Drains: none          Specimens: Gallbladder           Complications: None; patient tolerated the procedure well.         Disposition: PACU - hemodynamically stable.         Condition: stable  Wilmon Arms. Corliss Skains, MD, [facility]  10/31/2011 2:16 PM

## 2011-10-31 NOTE — Transfer of Care (Signed)
Immediate Anesthesia Transfer of Care Note  Patient: Amanda Nunez  Procedure(s) Performed: Procedure(s) (LRB): LAPAROSCOPIC CHOLECYSTECTOMY WITH INTRAOPERATIVE CHOLANGIOGRAM (N/A)  Patient Location: PACU  Anesthesia Type: General  Level of Consciousness: awake, alert  and oriented  Airway & Oxygen Therapy: Patient Spontanous Breathing and Patient connected to nasal cannula oxygen  Post-op Assessment: Report given to PACU RN and Post -op Vital signs reviewed and stable  Post vital signs: Reviewed and stable  Complications: No apparent anesthesia complications

## 2011-10-31 NOTE — ED Notes (Addendum)
Trauma MD [place] at bedside to discuss plan of care. Pt ambulated to bathroom. Pt complaining of 6/10 pain, pt aware of NPO status.

## 2011-10-31 NOTE — Preoperative (Signed)
Beta Blockers   Reason not to administer Beta Blockers:Not Applicable 

## 2011-10-31 NOTE — ED Provider Notes (Signed)
History     CSN: [id number]  Arrival date & time 10/31/11  0235   First MD Initiated Contact with Patient 10/31/11 0335      Chief Complaint  Patient presents with  . Abdominal Pain    (Consider location/radiation/quality/duration/timing/severity/associated sxs/prior treatment) HPI History provided by patient. Abdominal pain located right upper quadrant. Sharp in quality without radiation. Worse with anything she tries to eat or drink. Has long-standing history of gallstones and was previously scheduled to see a surgeon in February but did not have insurance or means to pay for elective surgery. No fevers or chills. Some nausea no vomiting.  No diarrhea. Patient evaluated 2 days ago at [facility] long emergency department had a CT scan demonstrating gallstones and an ultrasound demonstrating gallstones and sludge but no cholecystitis. Patient was given narcotic pain medications which she has been taking at home without relief of symptoms. She presents tonight requesting to have her gallbladder removed. Moderate in severity. No known alleviating factors. Past Medical History  Diagnosis Date  . Gallstones   . UTI (lower urinary tract infection)     History reviewed. No pertinent past surgical history.  No family history on file.  History  Substance Use Topics  . Smoking status: Current Everyday Smoker -- 0.5 packs/day  . Smokeless tobacco: Not on file  . Alcohol Use: Yes     social    OB History    Grav Para Term Preterm Abortions TAB SAB Ect Mult Living   2 2              Review of Systems  Constitutional: Negative for fever and chills.  HENT: Negative for neck pain and neck stiffness.   Eyes: Negative for pain.  Respiratory: Negative for shortness of breath.   Cardiovascular: Negative for chest pain.  Gastrointestinal: Positive for abdominal pain. Negative for diarrhea, constipation and blood in stool.  Genitourinary: Negative for dysuria.  Musculoskeletal: Negative for  back pain.  Skin: Negative for rash.  Neurological: Negative for headaches.  All other systems reviewed and are negative.    Allergies  Review of patient's allergies indicates no known allergies.  Home Medications   Current Outpatient Rx  Name Route Sig Dispense Refill  . LEVONORGESTREL 20 MCG/24HR IU IUD Intrauterine 1 each by Intrauterine route once.    . OXYCODONE-ACETAMINOPHEN 5-325 MG PO TABS Oral Take 1 tablet by mouth every 4 (four) hours as needed. Pain      BP 118/82  Pulse 82  Temp 98.8 F (37.1 C) (Oral)  Resp 18  SpO2 98%  LMP 09/28/2011  Physical Exam  Constitutional: She is oriented to person, place, and time. She appears well-developed and well-nourished.  HENT:  Head: Normocephalic and atraumatic.  Eyes: Conjunctivae and EOM are normal. Pupils are equal, round, and reactive to light.  Neck: Trachea normal. Neck supple. No thyromegaly present.  Cardiovascular: Normal rate, regular rhythm, S1 normal, S2 normal and normal pulses.     No systolic murmur is present   No diastolic murmur is present  Pulses:      Radial pulses are 2+ on the right side, and 2+ on the left side.  Pulmonary/Chest: Effort normal and breath sounds normal. She has no wheezes. She has no rhonchi. She has no rales. She exhibits no tenderness.  Abdominal: Soft. Normal appearance and bowel sounds are normal. There is no rebound, no guarding, no CVA tenderness and negative Murphy's sign.       Tender right upper quadrant. No  abnormal tenderness otherwise. Exam somewhat limited by body habitus  Musculoskeletal:       BLE:s Calves nontender, no cords or erythema, negative Homans sign  Neurological: She is alert and oriented to person, place, and time. She has normal strength. No cranial nerve deficit or sensory deficit. GCS eye subscore is 4. GCS verbal subscore is 5. GCS motor subscore is 6.  Skin: Skin is warm and dry. No rash noted. She is not diaphoretic.  Psychiatric: Her speech is  normal.       Cooperative and appropriate    ED Course  Procedures (including critical care time)  Labs Reviewed  CBC - Abnormal; Notable for the following:    Hemoglobin 15.2 (*)     All other components within normal limits  DIFFERENTIAL  BASIC METABOLIC PANEL  HEPATIC FUNCTION PANEL  LIPASE, BLOOD   US Abdomen Complete  10/29/2011  *RADIOLOGY REPORT*  Clinical Data:  Abdominal pain.  COMPLETE ABDOMINAL ULTRASOUND  Comparison:  CT abdomen and pelvis 10/29/2011 at 8:54 a.m.  Findings:  Gallbladder:  Multiple stones are seen in the gallbladder and there is gallbladder sludge.  No wall thickening or pericholecystic fluid.  Sonographer reports negative Murphy's sign.  Common bile duct:  Measures  Liver:  No focal lesion identified.  Within normal limits in parenchymal echogenicity.  IVC:  Appears normal.  Pancreas:  No focal abnormality seen.  Spleen:  Measures 7.9 cm and appears normal.  Right Kidney:  Measures 12.1 cm and appears normal.  Left Kidney:  Measures 12.3 cm and appears normal.  Abdominal aorta:  No aneurysm identified.  IMPRESSION: Multiple gallstones with gallbladder sludge.  No evidence of cholecystitis.  Original Report Authenticated By: Bernadene Bell. Maricela Curet, M.D.   Ct Abdomen Pelvis W Contrast  10/29/2011  *RADIOLOGY REPORT*  Clinical Data: Right lower quadrant pain with nausea and vomiting.  CT ABDOMEN AND PELVIS WITH CONTRAST  Technique:  Multidetector CT imaging of the abdomen and pelvis was performed following the standard protocol during bolus administration of intravenous contrast.  Contrast: OMNIPAQUE IOHEXOL 300 MG/ML  SOLN  Comparison: None.  Findings: Lung bases show minimal dependent atelectasis.  Heart size normal.  No pericardial or pleural effusion.  Liver is unremarkable.  Several stones are seen in the gallbladder. Question mild gallbladder wall thickening.  No inflammatory changes.  Adrenal glands, kidneys, spleen, pancreas, stomach and bowel, including  appendix, are unremarkable.  Small pelvic free fluid.  An intrauterine contraceptive device is seen in the lower uterine segment, rather than the expected location of the fundus.  Ovaries are visualized.  No pathologically enlarged lymph nodes.  No worrisome lytic or sclerotic lesions.  IMPRESSION:  1.  Cholelithiasis.  Question gallbladder wall thickening. Ultrasound limited right upper quadrant may be helpful in further evaluation, as clinically indicated. 2.  No evidence of acute appendicitis. 3.  Intrauterine contraceptive device is located in the lower uterine segment, rather than the expected location of the uterine fundus. 4.  Small pelvic free fluid.  Original Report Authenticated By: Reyes Ivan, M.D.   5:37 AM d/w [facility] Dr Dwain Sarna, will eval in ED  IV dilaudid pain control. Requiring repeat Dilaudid. N.p.o. IV fluids. MDM   Right upper quadrant abdominal pain with known gallstones.  Nursing notes reviewed. Vital signs reviewed. Old records reviewed as above. Labs obtained today and reviewed as above. No leukocytosis. No elevated LFTs. Presentation suggests persistent pain due to gallstones. General surgery consult for ED evaluation as above.  Plan admit  general surgery        Sunnie Nielsen, MD 10/31/11 [phone]

## 2011-10-31 NOTE — H&P (Signed)
Amanda Nunez is an 26 y.o. female.   Chief Complaint: abdominal pain consult from Dr. Dierdre Highman HPI:  26 yo obese female who presents with several years of known gallstones and occasional symptoms.  She has had more frequent episodes of ruq pain lately.  For the last week her ruq has been painful and made worse with eating.  She was seen in er a couple days ago and given percocet but this has not helped.  She has one episode of emesis.  She denies fevers.  She comes back in today due to persistence of ruq pain and inability to eat.  Past Medical History  Diagnosis Date  . Gallstones   . UTI (lower urinary tract infection)     History reviewed. No pertinent past surgical history.  No family history on file. Social History:  reports that she has been smoking.  She does not have any smokeless tobacco history on file. She reports that she drinks alcohol. She reports that she does not use illicit drugs.  Allergies: No Known Allergies Meds: none  Results for orders placed during the hospital encounter of 10/31/11 (from the past 48 hour(s))  CBC     Status: Abnormal   Collection Time   10/31/11  2:45 AM      Component Value Range Comment   WBC 10.4  4.0 - 10.5 K/uL    RBC 4.85  3.87 - 5.11 MIL/uL    Hemoglobin 15.2 (*) 12.0 - 15.0 g/dL    HCT 91.4  78.2 - 95.6 %    MCV 88.5  78.0 - 100.0 fL    MCH 31.3  26.0 - 34.0 pg    MCHC 35.4  30.0 - 36.0 g/dL    RDW 21.3  08.6 - 57.8 %    Platelets 264  150 - 400 K/uL   DIFFERENTIAL     Status: Normal   Collection Time   10/31/11  2:45 AM      Component Value Range Comment   Neutrophils Relative 65  43 - 77 %    Neutro Abs 6.8  1.7 - 7.7 K/uL    Lymphocytes Relative 24  12 - 46 %    Lymphs Abs 2.5  0.7 - 4.0 K/uL    Monocytes Relative 9  3 - 12 %    Monocytes Absolute 1.0  0.1 - 1.0 K/uL    Eosinophils Relative 2  0 - 5 %    Eosinophils Absolute 0.2  0.0 - 0.7 K/uL    Basophils Relative 0  0 - 1 %    Basophils Absolute 0.0  0.0 - 0.1 K/uL     BASIC METABOLIC PANEL     Status: Normal   Collection Time   10/31/11  2:45 AM      Component Value Range Comment   Sodium 138  135 - 145 mEq/L    Potassium 3.9  3.5 - 5.1 mEq/L    Chloride 101  96 - 112 mEq/L    CO2 27  19 - 32 mEq/L    Glucose, Bld 94  70 - 99 mg/dL    BUN 7  6 - 23 mg/dL    Creatinine, Ser 4.69  0.50 - 1.10 mg/dL    Calcium 9.1  8.4 - 62.9 mg/dL    GFR calc non Af Amer >90  >90 mL/min    GFR calc Af Amer >90  >90 mL/min   HEPATIC FUNCTION PANEL     Status: Normal  Collection Time   10/31/11  2:45 AM      Component Value Range Comment   Total Protein 7.6  6.0 - 8.3 g/dL    Albumin 3.9  3.5 - 5.2 g/dL    AST 21  0 - 37 U/L    ALT 23  0 - 35 U/L    Alkaline Phosphatase 59  39 - 117 U/L    Total Bilirubin 0.5  0.3 - 1.2 mg/dL    Bilirubin, Direct 0.1  0.0 - 0.3 mg/dL    Indirect Bilirubin 0.4  0.3 - 0.9 mg/dL   LIPASE, BLOOD     Status: Normal   Collection Time   10/31/11  2:45 AM      Component Value Range Comment   Lipase 22  11 - 59 U/L    US Abdomen Complete  10/29/2011  *RADIOLOGY REPORT*  Clinical Data:  Abdominal pain.  COMPLETE ABDOMINAL ULTRASOUND  Comparison:  CT abdomen and pelvis 10/29/2011 at 8:54 a.m.  Findings:  Gallbladder:  Multiple stones are seen in the gallbladder and there is gallbladder sludge.  No wall thickening or pericholecystic fluid.  Sonographer reports negative Murphy's sign.  Common bile duct:  Measures  Liver:  No focal lesion identified.  Within normal limits in parenchymal echogenicity.  IVC:  Appears normal.  Pancreas:  No focal abnormality seen.  Spleen:  Measures 7.9 cm and appears normal.  Right Kidney:  Measures 12.1 cm and appears normal.  Left Kidney:  Measures 12.3 cm and appears normal.  Abdominal aorta:  No aneurysm identified.  IMPRESSION: Multiple gallstones with gallbladder sludge.  No evidence of cholecystitis.  Original Report Authenticated By: Bernadene Bell. Maricela Curet, M.D.   Ct Abdomen Pelvis W Contrast  10/29/2011   *RADIOLOGY REPORT*  Clinical Data: Right lower quadrant pain with nausea and vomiting.  CT ABDOMEN AND PELVIS WITH CONTRAST  Technique:  Multidetector CT imaging of the abdomen and pelvis was performed following the standard protocol during bolus administration of intravenous contrast.  Contrast: OMNIPAQUE IOHEXOL 300 MG/ML  SOLN  Comparison: None.  Findings: Lung bases show minimal dependent atelectasis.  Heart size normal.  No pericardial or pleural effusion.  Liver is unremarkable.  Several stones are seen in the gallbladder. Question mild gallbladder wall thickening.  No inflammatory changes.  Adrenal glands, kidneys, spleen, pancreas, stomach and bowel, including appendix, are unremarkable.  Small pelvic free fluid.  An intrauterine contraceptive device is seen in the lower uterine segment, rather than the expected location of the fundus.  Ovaries are visualized.  No pathologically enlarged lymph nodes.  No worrisome lytic or sclerotic lesions.  IMPRESSION:  1.  Cholelithiasis.  Question gallbladder wall thickening. Ultrasound limited right upper quadrant may be helpful in further evaluation, as clinically indicated. 2.  No evidence of acute appendicitis. 3.  Intrauterine contraceptive device is located in the lower uterine segment, rather than the expected location of the uterine fundus. 4.  Small pelvic free fluid.  Original Report Authenticated By: Reyes Ivan, M.D.    ROS  Blood pressure 120/82, pulse 79, temperature 98.4 F (36.9 C), temperature source Oral, resp. rate 18, last menstrual period 09/28/2011, SpO2 99.00%. Physical Exam  Vitals reviewed. Constitutional: She appears well-developed and well-nourished.  Eyes: No scleral icterus.  Cardiovascular: Normal rate, regular rhythm and normal heart sounds.   Respiratory: Effort normal and breath sounds normal. She has no wheezes. She has no rales.  GI: Soft. Bowel sounds are normal. There is tenderness in the  right upper quadrant.  There is negative Murphy's sign. No hernia.     Assessment/Plan Symptomatic cholelithiasis I recommended admission and cholecystectomy due to pain.  I discussed surgery and told them my partner Dr. Corliss Skains would see her this am and take care of her today.   Amanda Nunez 10/31/2011, 6:52 AM

## 2011-10-31 NOTE — ED Notes (Signed)
Pt states that she went to [facility] on Thursday with similar pain. Pt states she was scanned and given pain medication to go home with pt denies relief from abdmonial pain that is right upper quadrant. Pt states she has a hx of gallstones and was suppose to have surgery in Feb but cancelled. Pt states pain started after she ate 5 days ago and eating makes the pain worse.

## 2011-10-31 NOTE — Anesthesia Preprocedure Evaluation (Addendum)
Anesthesia Evaluation  Patient identified by MRN, date of birth, ID band Patient awake    Reviewed: Allergy & Precautions, H&P , NPO status , Patient's Chart, lab work & pertinent test results  Airway Mallampati: II TM Distance: >3 FB Neck ROM: Full    Dental  (+) Dental Advisory Given and Teeth Intact   Pulmonary COPDCurrent Smoker,  + rhonchi         Cardiovascular negative cardio ROS      Neuro/Psych negative neurological ROS  negative psych ROS   GI/Hepatic negative GI ROS, Neg liver ROS,   Endo/Other  Morbid obesity  Renal/GU negative Renal ROS  negative genitourinary   Musculoskeletal negative musculoskeletal ROS (+)   Abdominal (+) + obese,   Peds negative pediatric ROS (+)  Hematology negative hematology ROS (+)   Anesthesia Other Findings   Reproductive/Obstetrics negative OB ROS                         Anesthesia Physical Anesthesia Plan  ASA: III and Emergent  Anesthesia Plan: General   Post-op Pain Management:    Induction: Intravenous  Airway Management Planned: Oral ETT  Additional Equipment:   Intra-op Plan:   Post-operative Plan: Extubation in OR  Informed Consent: I have reviewed the patients History and Physical, chart, labs and discussed the procedure including the risks, benefits and alternatives for the proposed anesthesia with the patient or authorized representative who has indicated his/her understanding and acceptance.     Plan Discussed with: CRNA and Surgeon  Anesthesia Plan Comments:        Anesthesia Quick Evaluation

## 2011-10-31 NOTE — ED Notes (Signed)
PT. REPORTS PERSISTENT RIGHT LATERAL ABDOMINAL PAIN FOR 5 DAYS , SEEN AT [facility] DIAGNOSED WITH UTI AND GALLBLADDER PAIN PRESCRIBED WITH ZOFRAN AND PERCOCET WITH NO RELIEF.

## 2011-11-01 LAB — CBC
Hemoglobin: 13 g/dL (ref 12.0–15.0)
MCH: 30 pg (ref 26.0–34.0)
Platelets: 230 10*3/uL (ref 150–400)
RBC: 4.34 MIL/uL (ref 3.87–5.11)
WBC: 13.3 10*3/uL — ABNORMAL HIGH (ref 4.0–10.5)

## 2011-11-01 LAB — COMPREHENSIVE METABOLIC PANEL
AST: 22 U/L (ref 0–37)
BUN: 9 mg/dL (ref 6–23)
CO2: 26 mEq/L (ref 19–32)
Calcium: 8.7 mg/dL (ref 8.4–10.5)
Creatinine, Ser: 0.94 mg/dL (ref 0.50–1.10)
GFR calc non Af Amer: 83 mL/min — ABNORMAL LOW (ref >90)

## 2011-11-01 MED ORDER — HYDROCODONE-ACETAMINOPHEN 5-325 MG PO TABS: 1.0000 | ORAL_TABLET | ORAL | Status: DC | PRN

## 2011-11-01 MED ORDER — HYDROCODONE-ACETAMINOPHEN 5-325 MG PO TABS
1.0000 | ORAL_TABLET | ORAL | Status: DC | PRN
  Administered 2011-11-01 – 2011-11-02 (×3): 2 via ORAL
  Filled 2011-11-01 (×3): qty 2

## 2011-11-01 MED ORDER — HYDROCODONE-ACETAMINOPHEN 5-325 MG PO TABS: 2.0000 | ORAL_TABLET | Freq: Four times a day (QID) | ORAL | Status: DC | PRN

## 2011-11-01 NOTE — Progress Notes (Signed)
1 Day Post-Op  Subjective: C/o pain  Objective: Vital signs in last 24 hours: Temp:  [98.2 F (36.8 C)-100 F (37.8 C)] 100 F (37.8 C) (06/16 1006) Pulse Rate:  [73-101] 101  (06/16 1006) Resp:  [11-27] 16  (06/16 1006) BP: (117-138)/(59-90) 125/73 mmHg (06/16 1006) SpO2:  [96 %-100 %] 96 % (06/16 1006) Weight:  [265 lb (120.203 kg)] 265 lb (120.203 kg) (06/15 1801) Last BM Date: 10/26/11  Intake/Output from previous day: 06/15 0701 - 06/16 0700 In: 750 [I.V.:750] Out: 1450 [Urine:1450] Intake/Output this shift:    General appearance: alert, cooperative and no distress GI: soft, appropriate tenderness, ND, incsions C/D/I,no peritoneal signs  Lab Results:   Basename 11/01/11 0533 10/31/11 0245  WBC 13.3* 10.4  HGB 13.0 15.2*  HCT 38.7 42.9  PLT 230 264   BMET  Basename 11/01/11 0533 10/31/11 0245  NA 136 138  K 3.6 3.9  CL 101 101  CO2 26 27  GLUCOSE 105* 94  BUN 9 7  CREATININE 0.94 0.82  CALCIUM 8.7 9.1   PT/INR No results found for this basename: LABPROT:2,INR:2 in the last 72 hours ABG No results found for this basename: PHART:2,PCO2:2,PO2:2,HCO3:2 in the last 72 hours  Studies/Results: Dg Cholangiogram Operative  10/31/2011  *RADIOLOGY REPORT*  Clinical Data:   Abdominal pain  INTRAOPERATIVE CHOLANGIOGRAM  Technique:  Cholangiographic images from the C-arm fluoroscopic device were submitted for interpretation post-operatively.  Please see the procedural report for the amount of contrast and the fluoroscopy time utilized.  Comparison:  CT abdomen pelvis - 10/29/2011; abdominal ultrasound - 10/29/2011  Findings:  Intraoperative angiographic images of the right upper quadrant during laparoscopic cholecystectomy provided for review.  There is selective cannulation of the central aspect of the cystic duct.  Provided angiographic images demonstrate brisk passage of contrast through the cystic duct and common bile duct with opacification of the descending portion  of the duodenum.  There is minimal reflux of injected contrast into a nondilated intrahepatic biliary system.  There are no discrete filling defects within the common bile or common hepatic duct to suggest the presence of choledocholithiasis.  IMPRESSION: Intraoperative cholangiogram as above. No definite evidence of choledocholithiasis.  Original Report Authenticated By: Waynard Reeds, M.D.    Anti-infectives: Anti-infectives     Start     Dose/Rate Route Frequency Ordered Stop   10/31/11 2000   ciprofloxacin (CIPRO) tablet 500 mg        500 mg Oral 2 times daily 10/31/11 1510     10/31/11 1600   cefOXitin (MEFOXIN) 2 g in dextrose 5 % 50 mL IVPB  Status:  Discontinued        2 g 100 mL/hr over 30 Minutes Intravenous Every 6 hours 10/31/11 1435 10/31/11 1517   10/31/11 0900   cefOXitin (MEFOXIN) 2 g in dextrose 5 % 50 mL IVPB     Comments: Give preop      2 g 100 mL/hr over 30 Minutes Intravenous  Once 10/31/11 0756 10/31/11 1021          Assessment/Plan: s/p Procedure(s) (LRB): LAPAROSCOPIC CHOLECYSTECTOMY WITH INTRAOPERATIVE CHOLANGIOGRAM (N/A) she is not eating much and still c/o pain.  She should be okay for discharge later today or tomorrow if tolerating diet and if pain controlled.  LOS: 1 day    Lodema Pilot DAVID 11/01/2011

## 2011-11-02 ENCOUNTER — Encounter (HOSPITAL_COMMUNITY): Payer: Self-pay | Admitting: Surgery

## 2011-11-02 MED ORDER — HYDROCODONE-ACETAMINOPHEN 5-325 MG PO TABS: 1.0000 | ORAL_TABLET | ORAL | Status: AC | PRN

## 2011-11-02 MED ORDER — PHENAZOPYRIDINE HCL 100 MG PO TABS: 100.0000 mg | ORAL_TABLET | Freq: Three times a day (TID) | ORAL | Status: AC

## 2011-11-02 NOTE — Discharge Instructions (Addendum)
Gallstones Gallstones are a form of gallbladder disease. The gallbladder is a small organ that helps you digest food.  HOME CARE  Only take medicine as told by your doctor.   Eat a low-fat diet.   Follow up as told.  GET HELP RIGHT AWAY IF:   Your pain gets worse.   You develop yellow skin or eyes (jaundice).   The pain moves to another part of your belly (abdomen) or back.   You have a temperature by mouth above 102 F (38.9 C), not controlled by medicine.   You feel sick to your stomach (nauseous) and throw up (vomit).  MAKE SURE YOU:   Understand these instructions.   Will watch your condition.   Will get help right away if you are not doing well or get worse.  Document Released: 10/21/2007 Document Revised: 04/23/2011 Document Reviewed: 04/09/2009 [facility] Patient Information 2012 [place], [place].Laparoscopic Cholecystectomy Laparoscopic cholecystectomy is surgery to remove the gallbladder. The gallbladder is located slightly to the right of center in the abdomen, behind the liver. It is a concentrating and storage sac for the bile produced in the liver. Bile aids in the digestion and absorption of fats. Gallbladder disease (cholecystitis) is an inflammation of your gallbladder. This condition is usually caused by a buildup of gallstones (cholelithiasis) in your gallbladder. Gallstones can block the flow of bile, resulting in inflammation and pain. In severe cases, emergency surgery may be required. When emergency surgery is not required, you will have time to prepare for the procedure. Laparoscopic surgery is an alternative to open surgery. Laparoscopic surgery usually has a shorter recovery time. Your common bile duct may also need to be examined and explored. Your caregiver will discuss this with you if he or she feels this should be done. If stones are found in the common bile duct, they may be removed. LET YOUR CAREGIVER KNOW ABOUT:  Allergies to food or medicine.    Medicines taken, including vitamins, herbs, eyedrops, over-the-counter medicines, and creams.   Use of steroids (by mouth or creams).   Previous problems with anesthetics or numbing medicines.   History of bleeding problems or blood clots.   Previous surgery.   Other health problems, including diabetes and kidney problems.   Possibility of pregnancy, if this applies.  RISKS AND COMPLICATIONS All surgery is associated with risks. Some problems that may occur following this procedure include:  Infection.   Damage to the common bile duct, nerves, arteries, veins, or other internal organs such as the stomach or intestines.   Bleeding.   A stone may remain in the common bile duct.  BEFORE THE PROCEDURE  Do not take aspirin for 3 days prior to surgery or blood thinners for 1 week prior to surgery.   Do not eat or drink anything after midnight the night before surgery.   Let your caregiver know if you develop a cold or other infectious problem prior to surgery.   You should be present 60 minutes before the procedure or as directed.  PROCEDURE  You will be given medicine that makes you sleep (general anesthetic). When you are asleep, your surgeon will make several small cuts (incisions) in your abdomen. One of these incisions is used to insert a small, lighted scope (laparoscope) into the abdomen. The laparoscope helps the surgeon see into your abdomen. Carbon dioxide gas will be pumped into your abdomen. The gas allows more room for the surgeon to perform your surgery. Other operating instruments are inserted through the other  incisions. Laparoscopic procedures may not be appropriate when:  There is major scarring from previous surgery.   The gallbladder is extremely inflamed.   There are bleeding disorders or unexpected cirrhosis of the liver.   A pregnancy is near term.   Other conditions make the laparoscopic procedure impossible.  If your surgeon feels it is not safe to  continue with a laparoscopic procedure, he or she will perform an open abdominal procedure. In this case, the surgeon will make an incision to open the abdomen. This gives the surgeon a larger view and field to work within. This may allow the surgeon to perform procedures that sometimes cannot be performed with a laparoscope alone. Open surgery has a longer recovery time. AFTER THE PROCEDURE  You will be taken to the recovery area where a nurse will watch and check your progress.   You may be allowed to go home the same day.   Do not resume physical activities until directed by your caregiver.   You may resume a normal diet and activities as directed.  Document Released: 05/04/2005 Document Revised: 04/23/2011 Document Reviewed: 10/17/2010 [facility] Patient Information 2012 [place], [place].Laparoscopic Cholecystectomy Laparoscopic cholecystectomy is surgery to remove the gallbladder. The gallbladder is located slightly to the right of center in the abdomen, behind the liver. It is a concentrating and storage sac for the bile produced in the liver. Bile aids in the digestion and absorption of fats. Gallbladder disease (cholecystitis) is an inflammation of your gallbladder. This condition is usually caused by a buildup of gallstones (cholelithiasis) in your gallbladder. Gallstones can block the flow of bile, resulting in inflammation and pain. In severe cases, emergency surgery may be required. When emergency surgery is not required, you will have time to prepare for the procedure. Laparoscopic surgery is an alternative to open surgery. Laparoscopic surgery usually has a shorter recovery time. Your common bile duct may also need to be examined and explored. Your caregiver will discuss this with you if he or she feels this should be done. If stones are found in the common bile duct, they may be removed. LET YOUR CAREGIVER KNOW ABOUT: Allergies to food or medicine.  Medicines taken, including vitamins,  herbs, eyedrops, over-the-counter medicines, and creams.  Use of steroids (by mouth or creams).  Previous problems with anesthetics or numbing medicines.  History of bleeding problems or blood clots.  Previous surgery.  Other health problems, including diabetes and kidney problems.  Possibility of pregnancy, if this applies.  RISKS AND COMPLICATIONS All surgery is associated with risks. Some problems that may occur following this procedure include: Infection.  Damage to the common bile duct, nerves, arteries, veins, or other internal organs such as the stomach or intestines.  Bleeding.  A stone may remain in the common bile duct.  BEFORE THE PROCEDURE Do not take aspirin for 3 days prior to surgery or blood thinners for 1 week prior to surgery.  Do not eat or drink anything after midnight the night before surgery.  Let your caregiver know if you develop a cold or other infectious problem prior to surgery.  You should be present 60 minutes before the procedure or as directed.  PROCEDURE  You will be given medicine that makes you sleep (general anesthetic). When you are asleep, your surgeon will make several small cuts (incisions) in your abdomen. One of these incisions is used to insert a small, lighted scope (laparoscope) into the abdomen. The laparoscope helps the surgeon see into your abdomen. Carbon dioxide  gas will be pumped into your abdomen. The gas allows more room for the surgeon to perform your surgery. Other operating instruments are inserted through the other incisions. Laparoscopic procedures may not be appropriate when: There is major scarring from previous surgery.  The gallbladder is extremely inflamed.  There are bleeding disorders or unexpected cirrhosis of the liver.  A pregnancy is near term.  Other conditions make the laparoscopic procedure impossible.  If your surgeon feels it is not safe to continue with a laparoscopic procedure, he or she will perform an open  abdominal procedure. In this case, the surgeon will make an incision to open the abdomen. This gives the surgeon a larger view and field to work within. This may allow the surgeon to perform procedures that sometimes cannot be performed with a laparoscope alone. Open surgery has a longer recovery time. AFTER THE PROCEDURE You will be taken to the recovery area where a nurse will watch and check your progress.  You may be allowed to go home the same day.  Do not resume physical activities until directed by your caregiver.  You may resume a normal diet and activities as directed.  Document Released: 05/04/2005 Document Revised: 04/23/2011 Document Reviewed: 10/17/2010 [facility] Patient Information 2012 [place], [place].

## 2011-11-02 NOTE — Progress Notes (Signed)
2 Days Post-Op  Subjective: Feels good this am, no c/o emesis or nausea, still some mild discomfort over surgical incision sites.   Objective: Vital signs in last 24 hours: Temp:  [98.5 F (36.9 C)-100 F (37.8 C)] 98.5 F (36.9 C) (06/17 0510) Pulse Rate:  [90-101] 90  (06/17 0510) Resp:  [16-18] 18  (06/17 0510) BP: (114-130)/(62-76) 119/62 mmHg (06/17 0510) SpO2:  [95 %-98 %] 96 % (06/17 0510) Last BM Date: 10/26/11  Intake/Output from previous day:   Intake/Output this shift:    General appearance: alert, cooperative and no distress Abdomen: soft, mildly tender over surgical incision sites, all wounds appear well approximated, no drng. Positive BS,Flatus. Lab Results:   Basename 11/01/11 0533 10/31/11 0245  WBC 13.3* 10.4  HGB 13.0 15.2*  HCT 38.7 42.9  PLT 230 264   BMET  Basename 11/01/11 0533 10/31/11 0245  NA 136 138  K 3.6 3.9  CL 101 101  CO2 26 27  GLUCOSE 105* 94  BUN 9 7  CREATININE 0.94 0.82  CALCIUM 8.7 9.1   PT/INR No results found for this basename: LABPROT:2,INR:2 in the last 72 hours ABG No results found for this basename: PHART:2,PCO2:2,PO2:2,HCO3:2 in the last 72 hours  Studies/Results: Dg Cholangiogram Operative  10/31/2011  *RADIOLOGY REPORT*  Clinical Data:   Abdominal pain  INTRAOPERATIVE CHOLANGIOGRAM  Technique:  Cholangiographic images from the C-arm fluoroscopic device were submitted for interpretation post-operatively.  Please see the procedural report for the amount of contrast and the fluoroscopy time utilized.  Comparison:  CT abdomen pelvis - 10/29/2011; abdominal ultrasound - 10/29/2011  Findings:  Intraoperative angiographic images of the right upper quadrant during laparoscopic cholecystectomy provided for review.  There is selective cannulation of the central aspect of the cystic duct.  Provided angiographic images demonstrate brisk passage of contrast through the cystic duct and common bile duct with opacification of the  descending portion of the duodenum.  There is minimal reflux of injected contrast into a nondilated intrahepatic biliary system.  There are no discrete filling defects within the common bile or common hepatic duct to suggest the presence of choledocholithiasis.  IMPRESSION: Intraoperative cholangiogram as above. No definite evidence of choledocholithiasis.  Original Report Authenticated By: Waynard Reeds, M.D.    Anti-infectives: Anti-infectives     Start     Dose/Rate Route Frequency Ordered Stop   10/31/11 2000   ciprofloxacin (CIPRO) tablet 500 mg  Status:  Discontinued        500 mg Oral 2 times daily 10/31/11 1510 11/01/11 1127   10/31/11 1600   cefOXitin (MEFOXIN) 2 g in dextrose 5 % 50 mL IVPB  Status:  Discontinued        2 g 100 mL/hr over 30 Minutes Intravenous Every 6 hours 10/31/11 1435 10/31/11 1517   10/31/11 0900   cefOXitin (MEFOXIN) 2 g in dextrose 5 % 50 mL IVPB     Comments: Give preop      2 g 100 mL/hr over 30 Minutes Intravenous  Once 10/31/11 0756 10/31/11 1021          Assessment/Plan: s/p Procedure(s) (LRB): LAPAROSCOPIC CHOLECYSTECTOMY WITH INTRAOPERATIVE CHOLANGIOGRAM (N/A) Discharge to home with f/u with Dr. Corliss Skains in 3 weeks time.  LOS: 2 days    Amanda Nunez 11/02/2011

## 2011-11-02 NOTE — Discharge Summary (Signed)
Physician Discharge Summary  Patient ID: Amanda Nunez MRN: [id number] DOB/AGE: Jan 15, 1986 26 y.o.  Admit date: 10/31/2011 Discharge date: 11/02/2011  Admission Diagnoses:Gallstones   UTI (lower urinary tract infection   Discharge Diagnoses:  status post cholecystectomy UTI (resolved)  Discharged Condition: stable  Hospital Course: 26 yo obese female who presented to [facility]  with several years of known gallstones and occasional symptoms. She had had more frequent episodes of ruq pain lately. And for the last week her ruq had been more painful and made worse with eating. She had been seen in ED a couple days prior to admission date, and given percocet, but this did not totally alleviate her symptoms. She continued to have periods of emesis and abdominal discomfort and inability to eat. For this reason she was admitted to the surgical service for a planned cholecystectomy. Postoperatively she has continued to do well; has remained hemodynamically stable and afebrile. At this juncture she is deemed stable for discharge to home with scheduled f/u with our office in 3 weeks time.   Consults: None  Significant Diagnostic Studies: labs and microbiology, cholangiogram.  Treatments: IV hydration, antibiotics, analgesia and surgery.  Discharge Exam: Blood pressure 119/62, pulse 90, temperature 98.5 F (36.9 C), temperature source Oral, resp. rate 18, height 5\' 7"  (1.702 m), weight 265 lb (120.203 kg), last menstrual period 09/28/2011, SpO2 96.00%. General appearance: alert, cooperative and no distress Abdomen is soft,obese, tender only over surgical incision sites, no bloating, no c/o emesis or nausea. All wounds appear to be healing well, no drng seen.  Disposition: 01-Home or Self Care  Discharge Orders    Future Appointments: Provider: Department: Dept Phone: Center:   11/10/2011 3:00 PM Maisie Fus A. Cornett, MD [facility] [phone] None     Medication List  As of 11/02/2011  8:33 AM   ASK your doctor about these medications         levonorgestrel 20 MCG/24HR IUD   Commonly known as: MIRENA   1 each by Intrauterine route once.      oxyCODONE-acetaminophen 5-325 MG per tablet   Commonly known as: PERCOCET   Take 1 tablet by mouth every 4 (four) hours as needed. Pain             Signed: Blenda Mounts 11/02/2011, 8:33 AM

## 2011-11-02 NOTE — Progress Notes (Signed)
Patient discharged to home in care of family. Medications and instructions reviewed with patient with no questions. IV d/c'd with cath intact. Assessment unchanged from this am. Patient is to follow up with Dr. Corliss Skains in 3 weeks.

## 2011-11-02 NOTE — Progress Notes (Signed)
Agree with plans per JD,PA.

## 2011-11-09 ENCOUNTER — Encounter (HOSPITAL_COMMUNITY): Payer: Self-pay

## 2011-11-09 ENCOUNTER — Telehealth (INDEPENDENT_AMBULATORY_CARE_PROVIDER_SITE_OTHER): Payer: Self-pay | Admitting: General Surgery

## 2011-11-09 ENCOUNTER — Emergency Department (INDEPENDENT_AMBULATORY_CARE_PROVIDER_SITE_OTHER)
Admission: EM | Admit: 2011-11-09 | Discharge: 2011-11-09 | Disposition: A | Discharge status: Home / Self Care | Payer: Self-pay | Source: Home / Self Care | Attending: Emergency Medicine | Admitting: Emergency Medicine

## 2011-11-09 DIAGNOSIS — N390 Urinary tract infection, site not specified: Secondary | ICD-10-CM

## 2011-11-09 LAB — POCT URINALYSIS DIP (DEVICE)
Bilirubin Urine: NEGATIVE
Glucose, UA: 100 mg/dL — AB
Specific Gravity, Urine: 1.015 (ref 1.005–1.030)
Urobilinogen, UA: 1 mg/dL (ref 0.0–1.0)

## 2011-11-09 MED ORDER — CEPHALEXIN 500 MG PO CAPS: 500.0000 mg | ORAL_CAPSULE | Freq: Three times a day (TID) | ORAL | Status: AC

## 2011-11-09 MED ORDER — PHENAZOPYRIDINE HCL 200 MG PO TABS: 200.0000 mg | ORAL_TABLET | Freq: Three times a day (TID) | ORAL | Status: AC | PRN

## 2011-11-09 NOTE — Discharge Instructions (Signed)

## 2011-11-09 NOTE — ED Provider Notes (Signed)
Chief Complaint  Patient presents with  . Urinary Tract Infection    History of Present Illness:   The patient is a 26 year old female who had a laparoscopic cholecystectomy 10 days ago. At that time she had the laparoscopy she was having some urinary tract symptoms. This was never addressed while she was in the hospital. Her symptoms have worsened since then and she called her surgeon today but was told to come over here for treatment. She describes frequency, urgency, and left lower quadrant pain. She does have a history of frequent urinary tract infections in the past. She has a Mirena IUD. Her last menstrual period was a week ago. She denies any GYN complaints, fever, chills, nausea, vomiting, or lower back pain. Her cholecystectomy is healing up well.  Review of Systems:  Other than noted above, the patient denies any of the following symptoms: General:  No fevers, chills, sweats, aches, or fatigue. GI:  No abdominal pain, back pain, nausea, vomiting, diarrhea, or constipation. GU:  No dysuria, frequency, urgency, hematuria, or incontinence. GYN:  No discharge, itching, vulvar pain or lesions, pelvic pain, or abnormal vaginal bleeding.  PMFSH:  Past medical history, family history, social history, meds, and allergies were reviewed.  Physical Exam:   Vital signs:  BP 120/83  Pulse 74  Temp 98.8 F (37.1 C) (Oral)  Resp 16  SpO2 98%  LMP 09/28/2011 Gen:  Alert, oriented, in no distress. Lungs:  Clear to auscultation, no wheezes, rales or rhonchi. Heart:  Regular rhythm, no gallop or murmer. Abdomen:  Flat and soft. There was slight suprapubic pain to palpation.  No guarding, or rebound.  No hepato-splenomegaly or mass.  Bowel sounds were normally active.  No hernia. Her cholecystectomy surgical sites are all healing up well without any evidence of infection. Back:  No CVA tenderness.  Skin:  Clear, warm and dry.  Labs:   Results for orders placed during the hospital encounter of  11/09/11  POCT URINALYSIS DIP (DEVICE)      Component Value Range   Glucose, UA 100 (*) NEGATIVE mg/dL   Bilirubin Urine NEGATIVE  NEGATIVE   Ketones, ur NEGATIVE  NEGATIVE mg/dL   Specific Gravity, Urine 1.015  1.005 - 1.030   Hgb urine dipstick NEGATIVE  NEGATIVE   pH 7.0  5.0 - 8.0   Protein, ur NEGATIVE  NEGATIVE mg/dL   Urobilinogen, UA 1.0  0.0 - 1.0 mg/dL   Nitrite POSITIVE (*) NEGATIVE   Leukocytes, UA NEGATIVE  NEGATIVE  POCT PREGNANCY, URINE      Component Value Range   Preg Test, Ur NEGATIVE  NEGATIVE    Other Labs Obtained at Urgent Care Center:  A urine culture was obtained.  Results are pending at this time and we will call about any positive results.  Assessment: The encounter diagnosis was UTI (lower urinary tract infection).   Plan:   1.  The following meds were prescribed:   New Prescriptions   CEPHALEXIN (KEFLEX) 500 MG CAPSULE    Take 1 capsule (500 mg total) by mouth 3 (three) times daily.   PHENAZOPYRIDINE (PYRIDIUM) 200 MG TABLET    Take 1 tablet (200 mg total) by mouth 3 (three) times daily as needed for pain.   2.  The patient was instructed in symptomatic care and handouts were given. 3.  The patient was told to return if becoming worse in any way, if no better in 3 or 4 days, and given some red flag symptoms that would  indicate earlier return. 4.  The patient was told to avoid intercourse for 10 days, get extra fluids, and return for a follow up with her primary care doctor at the completion of treatment for a repeat UA and culture.     Reuben Likes, MD 11/09/11 [phone]

## 2011-11-09 NOTE — ED Notes (Signed)
States she had problems w voiding QS prior to her GB surgery 6-15, and had to start on pyridium prior to release from hospital; saw her MD, Dr Fleeta Emmer today who told her to come to the [facility] for treatment of poss UTI, as she is still having pain w urination and feels as if she is not emptying her bladder as well as she shoukld; NAD

## 2011-11-09 NOTE — Telephone Encounter (Signed)
Called pt to let her know that she needed to go to a Urgent care per Dr Corliss Skains for recheck for a UTI

## 2011-11-09 NOTE — ED Notes (Signed)
No open OR port from GB on arrival

## 2011-11-10 ENCOUNTER — Ambulatory Visit (INDEPENDENT_AMBULATORY_CARE_PROVIDER_SITE_OTHER): Payer: Medicaid Other | Admitting: Surgery

## 2011-11-10 LAB — URINE CULTURE
Culture  Setup Time: 201111180355
Culture: NO GROWTH
Special Requests: NORMAL

## 2011-11-25 ENCOUNTER — Encounter (INDEPENDENT_AMBULATORY_CARE_PROVIDER_SITE_OTHER): Payer: Self-pay | Admitting: Surgery

## 2011-12-01 ENCOUNTER — Encounter (INDEPENDENT_AMBULATORY_CARE_PROVIDER_SITE_OTHER): Payer: Self-pay | Admitting: Surgery

## 2011-12-02 ENCOUNTER — Encounter (INDEPENDENT_AMBULATORY_CARE_PROVIDER_SITE_OTHER): Payer: Self-pay | Admitting: Surgery

## 2012-10-26 ENCOUNTER — Emergency Department (HOSPITAL_COMMUNITY): Payer: BC Managed Care – PPO

## 2012-10-26 ENCOUNTER — Emergency Department (HOSPITAL_COMMUNITY)
Admission: EM | Admit: 2012-10-26 | Discharge: 2012-10-26 | Disposition: A | Discharge status: Home / Self Care | Payer: BC Managed Care – PPO | Attending: Emergency Medicine | Admitting: Emergency Medicine

## 2012-10-26 ENCOUNTER — Encounter (HOSPITAL_COMMUNITY): Payer: Self-pay | Admitting: Radiology

## 2012-10-26 DIAGNOSIS — Z9049 Acquired absence of other specified parts of digestive tract: Secondary | ICD-10-CM | POA: Insufficient documentation

## 2012-10-26 DIAGNOSIS — Z3202 Encounter for pregnancy test, result negative: Secondary | ICD-10-CM | POA: Insufficient documentation

## 2012-10-26 DIAGNOSIS — R202 Paresthesia of skin: Secondary | ICD-10-CM

## 2012-10-26 DIAGNOSIS — F17200 Nicotine dependence, unspecified, uncomplicated: Secondary | ICD-10-CM | POA: Insufficient documentation

## 2012-10-26 DIAGNOSIS — R209 Unspecified disturbances of skin sensation: Secondary | ICD-10-CM | POA: Insufficient documentation

## 2012-10-26 DIAGNOSIS — Z8679 Personal history of other diseases of the circulatory system: Secondary | ICD-10-CM | POA: Insufficient documentation

## 2012-10-26 LAB — CBC WITH DIFFERENTIAL/PLATELET
Eosinophils Absolute: 0.2 10*3/uL (ref 0.0–0.7)
Eosinophils Relative: 2 % (ref 0–5)
Hemoglobin: 13.6 g/dL (ref 12.0–15.0)
Lymphs Abs: 2.3 10*3/uL (ref 0.7–4.0)
MCH: 30 pg (ref 26.0–34.0)
MCV: 88.5 fL (ref 78.0–100.0)
Monocytes Relative: 6 % (ref 3–12)
RBC: 4.54 MIL/uL (ref 3.87–5.11)

## 2012-10-26 LAB — URINALYSIS, ROUTINE W REFLEX MICROSCOPIC
Hgb urine dipstick: NEGATIVE
Leukocytes, UA: NEGATIVE
Protein, ur: NEGATIVE mg/dL
Urobilinogen, UA: 1 mg/dL (ref 0.0–1.0)

## 2012-10-26 LAB — RAPID URINE DRUG SCREEN, HOSP PERFORMED
Opiates: NOT DETECTED
Tetrahydrocannabinol: NOT DETECTED

## 2012-10-26 LAB — COMPREHENSIVE METABOLIC PANEL
BUN: 10 mg/dL (ref 6–23)
Calcium: 9 mg/dL (ref 8.4–10.5)
Creatinine, Ser: 0.8 mg/dL (ref 0.50–1.10)
GFR calc Af Amer: >90 mL/min (ref >90)
Glucose, Bld: 93 mg/dL (ref 70–99)
Total Protein: 7.2 g/dL (ref 6.0–8.3)

## 2012-10-26 LAB — PROTIME-INR: Prothrombin Time: 12.7 seconds (ref 11.6–15.2)

## 2012-10-26 NOTE — ED Provider Notes (Addendum)
See downtime charting  Olivia Mackie, MD 10/26/12 [phone]  Results for orders placed during the hospital encounter of 10/26/12  CBC WITH DIFFERENTIAL      Result Value Range   WBC 7.7  4.0 - 10.5 K/uL   RBC 4.54  3.87 - 5.11 MIL/uL   Hemoglobin 13.6  12.0 - 15.0 g/dL   HCT 96.0  45.4 - 09.8 %   MCV 88.5  78.0 - 100.0 fL   MCH 30.0  26.0 - 34.0 pg   MCHC 33.8  30.0 - 36.0 g/dL   RDW 11.9  14.7 - 82.9 %   Platelets 250  150 - 400 K/uL   Neutrophils Relative % 62  43 - 77 %   Neutro Abs 4.8  1.7 - 7.7 K/uL   Lymphocytes Relative 30  12 - 46 %   Lymphs Abs 2.3  0.7 - 4.0 K/uL   Monocytes Relative 6  3 - 12 %   Monocytes Absolute 0.4  0.1 - 1.0 K/uL   Eosinophils Relative 2  0 - 5 %   Eosinophils Absolute 0.2  0.0 - 0.7 K/uL   Basophils Relative 0  0 - 1 %   Basophils Absolute 0.0  0.0 - 0.1 K/uL  URINALYSIS, ROUTINE W REFLEX MICROSCOPIC      Result Value Range   Color, Urine YELLOW  YELLOW   APPearance CLEAR  CLEAR   Specific Gravity, Urine 1.011  1.005 - 1.030   pH 7.5  5.0 - 8.0   Glucose, UA NEGATIVE  NEGATIVE mg/dL   Hgb urine dipstick NEGATIVE  NEGATIVE   Bilirubin Urine NEGATIVE  NEGATIVE   Ketones, ur NEGATIVE  NEGATIVE mg/dL   Protein, ur NEGATIVE  NEGATIVE mg/dL   Urobilinogen, UA 1.0  0.0 - 1.0 mg/dL   Nitrite NEGATIVE  NEGATIVE   Leukocytes, UA NEGATIVE  NEGATIVE  URINE RAPID DRUG SCREEN (HOSP PERFORMED)      Result Value Range   Opiates NONE DETECTED  NONE DETECTED   Cocaine NONE DETECTED  NONE DETECTED   Benzodiazepines NONE DETECTED  NONE DETECTED   Amphetamines NONE DETECTED  NONE DETECTED   Tetrahydrocannabinol NONE DETECTED  NONE DETECTED   Barbiturates NONE DETECTED  NONE DETECTED  PREGNANCY, URINE      Result Value Range   Preg Test, Ur NEGATIVE  NEGATIVE   No results found.    Olivia Mackie, MD 10/26/12 [phone]

## 2012-11-24 ENCOUNTER — Ambulatory Visit (INDEPENDENT_AMBULATORY_CARE_PROVIDER_SITE_OTHER): Payer: BC Managed Care – PPO | Admitting: Obstetrics

## 2012-11-24 ENCOUNTER — Encounter: Payer: Self-pay | Admitting: Obstetrics

## 2012-11-24 VITALS — BP 124/84 | HR 72 | Temp 98.2°F | Ht 67.0 in | Wt 282.6 lb

## 2012-11-24 DIAGNOSIS — Z01419 Encounter for gynecological examination (general) (routine) without abnormal findings: Secondary | ICD-10-CM

## 2012-11-24 DIAGNOSIS — Z113 Encounter for screening for infections with a predominantly sexual mode of transmission: Secondary | ICD-10-CM

## 2012-11-24 DIAGNOSIS — Z30431 Encounter for routine checking of intrauterine contraceptive device: Secondary | ICD-10-CM

## 2012-11-24 DIAGNOSIS — Z3009 Encounter for other general counseling and advice on contraception: Secondary | ICD-10-CM

## 2012-11-24 DIAGNOSIS — N760 Acute vaginitis: Secondary | ICD-10-CM

## 2012-11-24 MED ORDER — NORETHIN ACE-ETH ESTRAD-FE 1-20 MG-MCG(24) PO TABS: 1.0000 | ORAL_TABLET | Freq: Every day | ORAL | Status: DC

## 2012-11-24 NOTE — Addendum Note (Signed)
Addended by: Julaine Hua on: 11/24/2012 04:15 PM   Modules accepted: Orders

## 2012-11-24 NOTE — Progress Notes (Signed)
.   Subjective:     Amanda Nunez is a 27 y.o. female here for a routine exam.  Current complaints: Patient would like her IUD removed today.  Personal health questionnaire reviewed: yes.   Gynecologic History No LMP recorded. Patient is not currently having periods (Reason: IUD). Contraception: IUD Last Pap: 2013. Results were: normal Last mammogram: N/A  Obstetric History OB History   Grav Para Term Preterm Abortions TAB SAB Ect Mult Living   2 2             # Outc Date GA Lbr Len/2nd Wgt Sex Del Anes PTL Lv   1 PAR            2 PAR                The following portions of the patient's history were reviewed and updated as appropriate: allergies, current medications, past family history, past medical history, past social history, past surgical history and problem list.  Review of Systems Pertinent items are noted in HPI.    Objective:    General appearance: alert Breasts: normal appearance, no masses or tenderness Abdomen: normal findings: soft, non-tender Pelvic: cervix normal in appearance, external genitalia normal, no adnexal masses or tenderness, no cervical motion tenderness, uterus normal size, shape, and consistency and vagina normal without discharge    IUD removed, intact.  Assessment:    Healthy female exam.   Contraceptive surveillance   Plan:    Education reviewed: self breast exams and weight bearing exercise. Contraception: IUD. Follow up in: 1 year. IUD removed.  Loestrin 24 Rx.

## 2012-11-25 LAB — PAP IG W/ RFLX HPV ASCU

## 2012-11-25 LAB — WET PREP BY MOLECULAR PROBE: Candida species: NEGATIVE

## 2012-11-25 LAB — GC/CHLAMYDIA PROBE AMP: GC Probe RNA: NEGATIVE

## 2012-11-29 ENCOUNTER — Other Ambulatory Visit: Payer: Self-pay | Admitting: *Deleted

## 2012-11-29 DIAGNOSIS — N760 Acute vaginitis: Secondary | ICD-10-CM

## 2012-11-29 MED ORDER — METRONIDAZOLE 500 MG PO TABS: 500.0000 mg | ORAL_TABLET | Freq: Two times a day (BID) | ORAL | Status: AC

## 2012-11-29 NOTE — Progress Notes (Signed)
Pt aware of results and prescription sent to pt's pharmacy. Pt expressed understanding.   

## 2012-12-09 LAB — ANAEROBIC CULTURE: Gram Stain: NONE SEEN

## 2013-03-22 ENCOUNTER — Other Ambulatory Visit: Payer: Self-pay | Admitting: *Deleted

## 2013-03-22 DIAGNOSIS — N390 Urinary tract infection, site not specified: Secondary | ICD-10-CM

## 2013-03-22 MED ORDER — NITROFURANTOIN MONOHYD MACRO 100 MG PO CAPS: 100.0000 mg | ORAL_CAPSULE | Freq: Two times a day (BID) | ORAL | Status: AC

## 2014-03-19 ENCOUNTER — Encounter: Payer: Self-pay | Admitting: Obstetrics

## 2016-04-06 ENCOUNTER — Other Ambulatory Visit: Payer: Self-pay | Admitting: Family Medicine

## 2016-04-06 ENCOUNTER — Ambulatory Visit
Admission: RE | Admit: 2016-04-06 | Discharge: 2016-04-06 | Disposition: A | Discharge status: Home / Self Care | Payer: BLUE CROSS/BLUE SHIELD | Source: Ambulatory Visit | Attending: Family Medicine | Admitting: Family Medicine

## 2016-04-06 DIAGNOSIS — N63 Unspecified lump in unspecified breast: Secondary | ICD-10-CM

## 2016-04-06 DIAGNOSIS — M5412 Radiculopathy, cervical region: Secondary | ICD-10-CM

## 2016-04-06 DIAGNOSIS — N644 Mastodynia: Secondary | ICD-10-CM

## 2016-04-13 ENCOUNTER — Ambulatory Visit
Admission: RE | Admit: 2016-04-13 | Discharge: 2016-04-13 | Disposition: A | Discharge status: Home / Self Care | Payer: BLUE CROSS/BLUE SHIELD | Source: Ambulatory Visit | Attending: Family Medicine | Admitting: Family Medicine

## 2016-04-13 ENCOUNTER — Other Ambulatory Visit: Payer: Self-pay | Admitting: Family Medicine

## 2016-04-13 DIAGNOSIS — N63 Unspecified lump in unspecified breast: Secondary | ICD-10-CM

## 2016-04-13 DIAGNOSIS — N6310 Unspecified lump in the right breast, unspecified quadrant: Secondary | ICD-10-CM

## 2016-04-13 DIAGNOSIS — N644 Mastodynia: Secondary | ICD-10-CM

## 2017-07-27 ENCOUNTER — Ambulatory Visit: Payer: BLUE CROSS/BLUE SHIELD | Admitting: Obstetrics

## 2017-08-28 ENCOUNTER — Other Ambulatory Visit: Payer: Self-pay

## 2017-08-28 ENCOUNTER — Encounter (HOSPITAL_COMMUNITY): Payer: Self-pay | Admitting: Emergency Medicine

## 2017-08-28 ENCOUNTER — Emergency Department (HOSPITAL_COMMUNITY)
Admission: EM | Admit: 2017-08-28 | Discharge: 2017-08-29 | Disposition: A | Discharge status: Home / Self Care | Payer: BLUE CROSS/BLUE SHIELD | Attending: Emergency Medicine | Admitting: Emergency Medicine

## 2017-08-28 DIAGNOSIS — N898 Other specified noninflammatory disorders of vagina: Secondary | ICD-10-CM

## 2017-08-28 DIAGNOSIS — F17200 Nicotine dependence, unspecified, uncomplicated: Secondary | ICD-10-CM | POA: Diagnosis not present

## 2017-08-28 DIAGNOSIS — Z79899 Other long term (current) drug therapy: Secondary | ICD-10-CM | POA: Insufficient documentation

## 2017-08-28 DIAGNOSIS — N939 Abnormal uterine and vaginal bleeding, unspecified: Secondary | ICD-10-CM | POA: Insufficient documentation

## 2017-08-28 NOTE — ED Triage Notes (Signed)
Per pt she has already had her period for the month but began bleeding again Thursday intermittently with pain, painful urination, and has noticed heavier flow than usual.

## 2017-08-29 LAB — URINALYSIS, ROUTINE W REFLEX MICROSCOPIC
BILIRUBIN URINE: NEGATIVE
Bacteria, UA: NONE SEEN
Glucose, UA: NEGATIVE mg/dL
Ketones, ur: NEGATIVE mg/dL
NITRITE: NEGATIVE
Protein, ur: NEGATIVE mg/dL
SPECIFIC GRAVITY, URINE: 1.008 (ref 1.005–1.030)
pH: 6 (ref 5.0–8.0)

## 2017-08-29 LAB — I-STAT CHEM 8, ED
BUN: 14 mg/dL (ref 6–20)
CALCIUM ION: 1.22 mmol/L (ref 1.15–1.40)
CREATININE: 0.9 mg/dL (ref 0.44–1.00)
Chloride: 104 mmol/L (ref 101–111)
GLUCOSE: 92 mg/dL (ref 65–99)
HCT: 31 % — ABNORMAL LOW (ref 36.0–46.0)
Hemoglobin: 10.5 g/dL — ABNORMAL LOW (ref 12.0–15.0)
Potassium: 3.5 mmol/L (ref 3.5–5.1)
Sodium: 139 mmol/L (ref 135–145)
TCO2: 23 mmol/L (ref 22–32)

## 2017-08-29 LAB — I-STAT BETA HCG BLOOD, ED (MC, WL, AP ONLY): I-stat hCG, quantitative: <5 m[IU]/mL (ref <5)

## 2017-08-29 LAB — WET PREP, GENITAL
Clue Cells Wet Prep HPF POC: NONE SEEN
Sperm: NONE SEEN
Trich, Wet Prep: NONE SEEN
Yeast Wet Prep HPF POC: NONE SEEN

## 2017-08-29 MED ORDER — CEFTRIAXONE SODIUM 250 MG IJ SOLR
250.0000 mg | Freq: Once | INTRAMUSCULAR | Status: AC
  Administered 2017-08-29: 250 mg via INTRAMUSCULAR
  Filled 2017-08-29: qty 250

## 2017-08-29 MED ORDER — LIDOCAINE HCL (PF) 1 % IJ SOLN
INTRAMUSCULAR | Status: AC
  Administered 2017-08-29: 1 mL
  Filled 2017-08-29: qty 5

## 2017-08-29 MED ORDER — AZITHROMYCIN 250 MG PO TABS
1000.0000 mg | ORAL_TABLET | Freq: Once | ORAL | Status: AC
  Administered 2017-08-29: 1000 mg via ORAL
  Filled 2017-08-29: qty 4

## 2017-08-29 NOTE — ED Provider Notes (Signed)
[facility] EMERGENCY DEPARTMENT Provider Note   CSN: [id number] Arrival date & time: 08/28/17  2330     History   Chief Complaint Chief Complaint  Patient presents with  . Vaginal Bleeding    HPI Amanda Nunez is a 32 y.o. female.  Patient presents to the emergency department with a chief complaint of vaginal bleeding and discharge.  She reports that she recently finished her period.  She states that after finishing, she had intercourse with a new sexual partner, noticed some recurrence of her bleeding.  She also complains of some mild discharge and some pressure when she urinates.  She denies any fevers or chills.  Denies any nausea, vomiting, or diarrhea.  She has not taken anything for symptoms.  The history is provided by the patient. No language interpreter was used.    Past Medical History:  Diagnosis Date  . Gallstones   . UTI (lower urinary tract infection)     There are no active problems to display for this patient.   Past Surgical History:  Procedure Laterality Date  . CHOLECYSTECTOMY  10/31/2011   Procedure: LAPAROSCOPIC CHOLECYSTECTOMY WITH INTRAOPERATIVE CHOLANGIOGRAM;  Surgeon: Wilmon Arms. Corliss Skains, MD;  Location: [facility] OR;  Service: General;  Laterality: N/A;     OB History    Gravida  2   Para  2   Term      Preterm      AB      Living        SAB      TAB      Ectopic      Multiple      Live Births               Home Medications    Prior to Admission medications   Medication Sig Start Date End Date Taking? Authorizing Provider  acetaminophen (TYLENOL) 500 MG tablet Take 500 mg by mouth once.    [provider]  levonorgestrel (MIRENA) 20 MCG/24HR IUD 1 each by Intrauterine route once.    [provider]  Norethindrone Acetate-Ethinyl Estrad-FE (LOESTRIN 24 FE) 1-20 MG-MCG(24) tablet Take 1 tablet by mouth daily. 11/24/12   Brock Bad, MD    Family History Family History  Problem  Relation Age of Onset  . Multiple sclerosis Mother     Social History Social History   Tobacco Use  . Smoking status: Current Some Day Smoker    Packs/day: 0.50  Substance Use Topics  . Alcohol use: No    Comment: social  . Drug use: No     Allergies   Patient has no known allergies.   Review of Systems Review of Systems  All other systems reviewed and are negative.    Physical Exam Updated Vital Signs BP (!) 160/113 (BP Location: Right Arm)   Pulse 81   Temp 98.6 F (37 C) (Axillary)   Resp 18   LMP 08/14/2017 (Exact Date)   SpO2 100%   Physical Exam  Constitutional: She is oriented to person, place, and time. She appears well-developed and well-nourished.  HENT:  Head: Normocephalic and atraumatic.  Eyes: Pupils are equal, round, and reactive to light. Conjunctivae and EOM are normal.  Neck: Normal range of motion. Neck supple.  Cardiovascular: Normal rate and regular rhythm. Exam reveals no gallop and no friction rub.  No murmur heard. Pulmonary/Chest: Effort normal and breath sounds normal. No respiratory distress. She has no wheezes. She has no rales. She exhibits  no tenderness.  Abdominal: Soft. Bowel sounds are normal. She exhibits no distension and no mass. There is no tenderness. There is no rebound and no guarding.  Genitourinary:  Genitourinary Comments: Chaperone present for pelvic exam, no active bleeding cervical os, there is a scant amount of old blood in the vaginal vault, mild clear to white discharge, no adnexal or uterine tenderness  Musculoskeletal: Normal range of motion. She exhibits no edema or tenderness.  Neurological: She is alert and oriented to person, place, and time.  Skin: Skin is warm and dry.  Psychiatric: She has a normal mood and affect. Her behavior is normal. Judgment and thought content normal.  Nursing note and vitals reviewed.    ED Treatments / Results  Labs (all labs ordered are listed, but only abnormal results are  displayed) Labs Reviewed  WET PREP, GENITAL - Abnormal; Notable for the following components:      Result Value   WBC, Wet Prep HPF POC MANY (*)    All other components within normal limits  URINALYSIS, ROUTINE W REFLEX MICROSCOPIC - Abnormal; Notable for the following components:   Color, Urine STRAW (*)    Hgb urine dipstick MODERATE (*)    Leukocytes, UA SMALL (*)    Squamous Epithelial / LPF 0-5 (*)    All other components within normal limits  I-STAT CHEM 8, ED - Abnormal; Notable for the following components:   Hemoglobin 10.5 (*)    HCT 31.0 (*)    All other components within normal limits  I-STAT BETA HCG BLOOD, ED ([facility], [facility], [facility] ONLY)  GC/CHLAMYDIA PROBE AMP ([place]) NOT [facility]    EKG None  Radiology No results found.  Procedures Procedures (including critical care time)  Medications Ordered in ED Medications  azithromycin (ZITHROMAX) tablet 1,000 mg (1,000 mg Oral Given 08/29/17 0155)  cefTRIAXone (ROCEPHIN) injection 250 mg (250 mg Intramuscular Given 08/29/17 0155)  lidocaine (PF) (XYLOCAINE) 1 % injection (1 mL  Given 08/29/17 0201)     Initial Impression / Assessment and Plan / ED Course  I have reviewed the triage vital signs and the nursing notes.  Pertinent labs & imaging results that were available during my care of the patient were reviewed by me and considered in my medical decision making (see chart for details).     Patient with vaginal bleeding and discharge.  Hemoglobin is 10.5.  Patient is not tachycardic nor hypotensive.  She looks well.  She is in no acute distress.  Will recommend close follow-up.  She has no adnexal or uterine tenderness on exam.  She does have some discharge.  She has had a new sexual partner.  We will treat for gonorrhea and chlamydia.  Final Clinical Impressions(s) / ED Diagnoses   Final diagnoses:  Vaginal bleeding  Vaginal discharge    ED Discharge Orders    None       Roxy HorsemanBrowning, Pike Scantlebury, PA-C 08/29/17  0231    Melene PlanFloyd, Dan, DO 08/29/17 [phone]

## 2017-08-30 LAB — GC/CHLAMYDIA PROBE AMP (~~LOC~~) NOT AT ARMC
Chlamydia: NEGATIVE
NEISSERIA GONORRHEA: NEGATIVE

## 2018-12-26 ENCOUNTER — Other Ambulatory Visit: Payer: Self-pay

## 2018-12-26 DIAGNOSIS — Z20822 Contact with and (suspected) exposure to covid-19: Secondary | ICD-10-CM

## 2018-12-27 LAB — NOVEL CORONAVIRUS, NAA: SARS-CoV-2, NAA: DETECTED — AB

## 2019-01-04 ENCOUNTER — Other Ambulatory Visit: Payer: Self-pay

## 2019-01-04 DIAGNOSIS — Z20822 Contact with and (suspected) exposure to covid-19: Secondary | ICD-10-CM

## 2019-01-05 LAB — NOVEL CORONAVIRUS, NAA: SARS-CoV-2, NAA: NOT DETECTED

## 2019-04-15 ENCOUNTER — Encounter (HOSPITAL_COMMUNITY): Payer: Self-pay | Admitting: *Deleted

## 2019-04-15 ENCOUNTER — Ambulatory Visit (HOSPITAL_COMMUNITY)
Admission: EM | Admit: 2019-04-15 | Discharge: 2019-04-15 | Disposition: A | Discharge status: Home / Self Care | Payer: Self-pay | Attending: Family Medicine | Admitting: Family Medicine

## 2019-04-15 ENCOUNTER — Other Ambulatory Visit: Payer: Self-pay

## 2019-04-15 DIAGNOSIS — N898 Other specified noninflammatory disorders of vagina: Secondary | ICD-10-CM | POA: Insufficient documentation

## 2019-04-15 DIAGNOSIS — R030 Elevated blood-pressure reading, without diagnosis of hypertension: Secondary | ICD-10-CM | POA: Insufficient documentation

## 2019-04-15 DIAGNOSIS — Z3202 Encounter for pregnancy test, result negative: Secondary | ICD-10-CM

## 2019-04-15 DIAGNOSIS — R300 Dysuria: Secondary | ICD-10-CM | POA: Insufficient documentation

## 2019-04-15 LAB — POC URINE PREG, ED: Preg Test, Ur: NEGATIVE

## 2019-04-15 LAB — POCT URINALYSIS DIP (DEVICE)
Bilirubin Urine: NEGATIVE
Glucose, UA: NEGATIVE mg/dL
Hgb urine dipstick: NEGATIVE
Ketones, ur: NEGATIVE mg/dL
Leukocytes,Ua: NEGATIVE
Nitrite: NEGATIVE
Protein, ur: NEGATIVE mg/dL
Specific Gravity, Urine: 1.01 (ref 1.005–1.030)
Urobilinogen, UA: 0.2 mg/dL (ref 0.0–1.0)
pH: 6.5 (ref 5.0–8.0)

## 2019-04-15 LAB — POCT PREGNANCY, URINE: Preg Test, Ur: NEGATIVE

## 2019-04-15 MED ORDER — CEFTRIAXONE SODIUM 250 MG IJ SOLR
INTRAMUSCULAR | Status: AC
  Filled 2019-04-15: qty 250

## 2019-04-15 MED ORDER — METRONIDAZOLE 500 MG PO TABS: 500.0000 mg | ORAL_TABLET | Freq: Two times a day (BID) | ORAL | 0 refills | Status: AC

## 2019-04-15 MED ORDER — AMLODIPINE BESYLATE 5 MG PO TABS: 5.0000 mg | ORAL_TABLET | Freq: Every day | ORAL | 11 refills | Status: DC

## 2019-04-15 MED ORDER — CEFTRIAXONE SODIUM 250 MG IJ SOLR
250.0000 mg | Freq: Once | INTRAMUSCULAR | Status: AC
  Administered 2019-04-15: 14:00:00 250 mg via INTRAMUSCULAR

## 2019-04-15 MED ORDER — AZITHROMYCIN 250 MG PO TABS
ORAL_TABLET | ORAL | Status: AC
  Filled 2019-04-15: qty 4

## 2019-04-15 MED ORDER — AZITHROMYCIN 250 MG PO TABS
1000.0000 mg | ORAL_TABLET | Freq: Once | ORAL | Status: AC
  Administered 2019-04-15: 14:00:00 1000 mg via ORAL

## 2019-04-15 NOTE — ED Provider Notes (Signed)
  [place]    CSN: [id number] Arrival date & time: 04/15/19  1213  Chief Complaint  Patient presents with  . Dysuria  . Vaginal Discharge    Amanda Nunez is a 33 y.o. female here for possible UTI.  Duration: 1 month. Symptoms: Dysuria, urinary frequency, urgency, hesitancy Denies: hematuria, fever, nausea and vomiting, flank pain Hx of recurrent UTI? No +new sexual partners.  Pt has a hx of elevated BP. She does use cocaine relatively frequently. Last use was 1 week ago. BP has been high even without recent use.   ROS:  Constitutional: denies fever GU: As noted in HPI  Past Medical History:  Diagnosis Date  . Gallstones   . UTI (lower urinary tract infection)      BP (!) 154/102 Comment: "it's high most of the time" over past 10 yrs  Pulse 73   Temp (!) 97.2 F (36.2 C) (Oral)   Resp 18   LMP 04/08/2019 (Approximate)   SpO2 100%  General: Awake, alert, appears stated age Heart: RRR Lungs: CTAB, normal respiratory effort, no accessory muscle usage Abd: BS+, soft, NT, ND, no masses or organomegaly MSK: No CVA tenderness, neg Lloyd's sign Psych: Age appropriate judgment and insight  Final Clinical Impressions(s) / UC Diagnoses   Final diagnoses:  Elevated blood pressure reading  Vaginal discharge  Dysuria   Stop using cocaine.  Start Norvasc until she can get back in w Dr. Jacelyn Grip. Empirically cover for GC/chlamydia. Flagyl for possible BV vs trich.    Discharge Instructions     See your PCP regarding your blood pressure. I have called in enough to get you into 2021.   Stop using cocaine.  Practice safe sex.  Your urine does not look like there is an infection.   Give [place] a few days to get the results back. No news is good news though.     ED Prescriptions    Medication Sig Dispense Auth. Provider   amLODipine (NORVASC) 5 MG tablet Take 1 tablet (5 mg total) by mouth daily. 30 tablet Shelda Pal, DO   metroNIDAZOLE  (FLAGYL) 500 MG tablet Take 1 tablet (500 mg total) by mouth 2 (two) times daily for 7 days. Do not drink alcohol. 14 tablet Shelda Pal, DO     PDMP not reviewed this encounter.   Shelda Pal, DO 04/15/19 1430

## 2019-04-15 NOTE — ED Triage Notes (Signed)
Had abx course called in for UTI sxs in beginning of Oct; pt completed course, but has never had relief of dysuria, polyuria, urinary urgency.  Also c/o low abd pain x 1 month intermittently.  Reports possibly slight vaginal discharge with odor.  Denies fevers.

## 2019-04-15 NOTE — Discharge Instructions (Addendum)
See your PCP regarding your blood pressure. I have called in enough to get you into 2021.   Stop using cocaine.  Practice safe sex.  Your urine does not look like there is an infection.   Give [place] a few days to get the results back. No news is good news though.

## 2019-04-17 LAB — CERVICOVAGINAL ANCILLARY ONLY
Chlamydia: NEGATIVE
Neisseria Gonorrhea: NEGATIVE
Trichomonas: NEGATIVE

## 2020-04-11 ENCOUNTER — Encounter (HOSPITAL_COMMUNITY): Payer: Self-pay | Admitting: Emergency Medicine

## 2020-04-11 ENCOUNTER — Emergency Department (HOSPITAL_COMMUNITY)
Admission: EM | Admit: 2020-04-11 | Discharge: 2020-04-11 | Disposition: A | Discharge status: Home / Self Care | Payer: BLUE CROSS/BLUE SHIELD | Attending: Emergency Medicine | Admitting: Emergency Medicine

## 2020-04-11 ENCOUNTER — Other Ambulatory Visit: Payer: Self-pay

## 2020-04-11 ENCOUNTER — Emergency Department (HOSPITAL_COMMUNITY): Payer: BLUE CROSS/BLUE SHIELD

## 2020-04-11 DIAGNOSIS — D649 Anemia, unspecified: Secondary | ICD-10-CM | POA: Diagnosis not present

## 2020-04-11 DIAGNOSIS — I10 Essential (primary) hypertension: Secondary | ICD-10-CM | POA: Insufficient documentation

## 2020-04-11 DIAGNOSIS — F1920 Other psychoactive substance dependence, uncomplicated: Secondary | ICD-10-CM | POA: Diagnosis not present

## 2020-04-11 DIAGNOSIS — F1990 Other psychoactive substance use, unspecified, uncomplicated: Secondary | ICD-10-CM

## 2020-04-11 DIAGNOSIS — Z9119 Patient's noncompliance with other medical treatment and regimen: Secondary | ICD-10-CM | POA: Diagnosis not present

## 2020-04-11 DIAGNOSIS — Z79899 Other long term (current) drug therapy: Secondary | ICD-10-CM | POA: Diagnosis not present

## 2020-04-11 DIAGNOSIS — R519 Headache, unspecified: Secondary | ICD-10-CM | POA: Insufficient documentation

## 2020-04-11 DIAGNOSIS — Z7982 Long term (current) use of aspirin: Secondary | ICD-10-CM | POA: Insufficient documentation

## 2020-04-11 DIAGNOSIS — F17200 Nicotine dependence, unspecified, uncomplicated: Secondary | ICD-10-CM | POA: Insufficient documentation

## 2020-04-11 DIAGNOSIS — Z91199 Patient's noncompliance with other medical treatment and regimen due to unspecified reason: Secondary | ICD-10-CM

## 2020-04-11 HISTORY — DX: Essential (primary) hypertension: I10

## 2020-04-11 LAB — COMPREHENSIVE METABOLIC PANEL
ALT: 14 U/L (ref 0–44)
AST: 17 U/L (ref 15–41)
Albumin: 4.1 g/dL (ref 3.5–5.0)
Alkaline Phosphatase: 61 U/L (ref 38–126)
Anion gap: 8 (ref 5–15)
BUN: 13 mg/dL (ref 6–20)
CO2: 24 mmol/L (ref 22–32)
Calcium: 8.8 mg/dL — ABNORMAL LOW (ref 8.9–10.3)
Chloride: 104 mmol/L (ref 98–111)
Creatinine, Ser: 0.8 mg/dL (ref 0.44–1.00)
GFR, Estimated: >60 mL/min (ref >60)
Glucose, Bld: 91 mg/dL (ref 70–99)
Potassium: 3.4 mmol/L — ABNORMAL LOW (ref 3.5–5.1)
Sodium: 136 mmol/L (ref 135–145)
Total Bilirubin: 0.5 mg/dL (ref 0.3–1.2)
Total Protein: 7.5 g/dL (ref 6.5–8.1)

## 2020-04-11 LAB — CBC WITH DIFFERENTIAL/PLATELET
Abs Immature Granulocytes: 0.01 10*3/uL (ref 0.00–0.07)
Basophils Absolute: 0.1 10*3/uL (ref 0.0–0.1)
Basophils Relative: 1 %
Eosinophils Absolute: 0.4 10*3/uL (ref 0.0–0.5)
Eosinophils Relative: 5 %
HCT: 29.5 % — ABNORMAL LOW (ref 36.0–46.0)
Hemoglobin: 8.1 g/dL — ABNORMAL LOW (ref 12.0–15.0)
Immature Granulocytes: 0 %
Lymphocytes Relative: 29 %
Lymphs Abs: 2.1 10*3/uL (ref 0.7–4.0)
MCH: 18.9 pg — ABNORMAL LOW (ref 26.0–34.0)
MCHC: 27.5 g/dL — ABNORMAL LOW (ref 30.0–36.0)
MCV: 68.8 fL — ABNORMAL LOW (ref 80.0–100.0)
Monocytes Absolute: 0.7 10*3/uL (ref 0.1–1.0)
Monocytes Relative: 9 %
Neutro Abs: 4.1 10*3/uL (ref 1.7–7.7)
Neutrophils Relative %: 56 %
Platelets: 339 10*3/uL (ref 150–400)
RBC: 4.29 MIL/uL (ref 3.87–5.11)
RDW: 19.2 % — ABNORMAL HIGH (ref 11.5–15.5)
WBC: 7.4 10*3/uL (ref 4.0–10.5)
nRBC: 0 % (ref 0.0–0.2)

## 2020-04-11 LAB — URINALYSIS, ROUTINE W REFLEX MICROSCOPIC
Bilirubin Urine: NEGATIVE
Glucose, UA: NEGATIVE mg/dL
Hgb urine dipstick: NEGATIVE
Ketones, ur: NEGATIVE mg/dL
Leukocytes,Ua: NEGATIVE
Nitrite: NEGATIVE
Protein, ur: NEGATIVE mg/dL
Specific Gravity, Urine: 1.004 — ABNORMAL LOW (ref 1.005–1.030)
pH: 6 (ref 5.0–8.0)

## 2020-04-11 LAB — I-STAT BETA HCG BLOOD, ED (MC, WL, AP ONLY): I-stat hCG, quantitative: <5 m[IU]/mL (ref <5)

## 2020-04-11 LAB — LIPASE, BLOOD: Lipase: 32 U/L (ref 11–51)

## 2020-04-11 MED ORDER — AMLODIPINE BESYLATE 5 MG PO TABS: 5.0000 mg | ORAL_TABLET | Freq: Every day | ORAL | 0 refills | Status: DC

## 2020-04-11 MED ORDER — CLONIDINE HCL 0.1 MG PO TABS
0.1000 mg | ORAL_TABLET | Freq: Once | ORAL | Status: AC
  Administered 2020-04-11: 0.1 mg via ORAL
  Filled 2020-04-11: qty 1

## 2020-04-11 MED ORDER — AMLODIPINE BESYLATE 5 MG PO TABS
5.0000 mg | ORAL_TABLET | Freq: Once | ORAL | Status: AC
  Administered 2020-04-11: 5 mg via ORAL
  Filled 2020-04-11: qty 1

## 2020-04-11 NOTE — ED Provider Notes (Signed)
[facility] Provider Note   CSN: [id number] Arrival date & time: 04/11/20  1920     History Chief Complaint  Patient presents with  . Hypertension    Amanda Nunez is a 34 y.o. female with history of cholecystectomy, hypertension presents to the ED for concern over her elevated blood pressure reading at home that was 165/121. Patient states she had sudden onset heart fluttering sensation at around 4 PM today. This has been intermittent and lasting only a few seconds, fleeting. She then noticed her head felt "full" and foggy. She took her blood pressure and it was elevated. States she doesn't really have pain in her head states it is just "annoying", pressure, generalized like her head is full of air. Does report history of intermittent migraines in the past but this feels a little bit different. No head trauma. Normal anticoagulants. Denies associated vision changes, vomiting, neck pain or stiffness, fever, stroke symptoms. States over the last 1.5 weeks she has had intermittent crampy pain in the lower abdomen and in the right low back. The pain comes and goes. No current abdominal pain. Has attributed it to being constipated but took a laxative yesterday and had a bowel movement today. Her abdominal pain "moves around" all over her abdomen. Has been a little nauseous last 2 days as well but no vomiting. Denies dysuria, hematuria, vaginal symptoms. History of UTI many years ago. No history of kidney stones. Admits to using cocaine every now and then, usually in the weekends. Last use was last night along with alcohol. States she went to urgent care almost a year ago for abdominal pain issues and was told her blood pressure was high at that time, was prescribed amlodipine but felt like she could manage her blood pressure without many medicines. Denies any chest pain, shortness of breath.   HPI     Past Medical History:  Diagnosis Date  . Gallstones   .  Hypertension   . UTI (lower urinary tract infection)     There are no problems to display for this patient.   Past Surgical History:  Procedure Laterality Date  . CHOLECYSTECTOMY  10/31/2011   Procedure: LAPAROSCOPIC CHOLECYSTECTOMY WITH INTRAOPERATIVE CHOLANGIOGRAM;  Surgeon: Wilmon Arms. Corliss Skains, MD;  Location: [facility] OR;  Service: General;  Laterality: N/A;  . gallstones       OB History    Gravida  2   Para  2   Term      Preterm      AB      Living        SAB      TAB      Ectopic      Multiple      Live Births              Family History  Problem Relation Age of Onset  . Multiple sclerosis Mother     Social History   Tobacco Use  . Smoking status: Current Some Day Smoker    Packs/day: 0.50  . Smokeless tobacco: Never Used  Vaping Use  . Vaping Use: Never used  Substance Use Topics  . Alcohol use: Yes    Comment: heavy daily drinker  . Drug use: Yes    Types: Cocaine    Comment: hx "all of them"    Home Medications Prior to Admission medications   Medication Sig Start Date End Date Taking? Authorizing Provider  aspirin-acetaminophen-caffeine (EXCEDRIN MIGRAINE) [phone] MG tablet Take 2  tablets by mouth every 6 (six) hours as needed for headache.   Yes [provider]  fluticasone (FLONASE) 50 MCG/ACT nasal spray Place 1-2 sprays into both nostrils daily as needed for allergies or rhinitis.   Yes [provider]  polyvinyl alcohol (LIQUIFILM TEARS) 1.4 % ophthalmic solution Place 1 drop into both eyes as needed for dry eyes.   Yes [provider]  amLODipine (NORVASC) 5 MG tablet Take 1 tablet (5 mg total) by mouth daily. 04/11/20 05/11/20  Liberty Handy, PA-C  levonorgestrel (MIRENA) 20 MCG/24HR IUD 1 each by Intrauterine route once.  04/15/19  [provider]  Norethindrone Acetate-Ethinyl Estrad-FE (LOESTRIN 24 FE) 1-20 MG-MCG(24) tablet Take 1 tablet by mouth daily. 11/24/12 04/15/19  Brock Bad,  MD    Allergies    Patient has no known allergies.  Review of Systems   Review of Systems  Cardiovascular: Positive for palpitations.  Gastrointestinal: Positive for abdominal pain and constipation.  Neurological: Positive for headaches (discomfort).  All other systems reviewed and are negative.   Physical Exam Updated Vital Signs BP (!) 177/98   Pulse 80   Temp 97.9 F (36.6 C) (Oral)   Resp (!) 27   Ht 5\' 7"  (1.702 m)   Wt 117.9 kg   SpO2 100%   BMI 40.72 kg/m   Physical Exam Vitals and nursing note reviewed.  Constitutional:      General: She is not in acute distress.    Appearance: She is well-developed.     Comments: NAD.  HENT:     Head: Normocephalic and atraumatic.     Right Ear: External ear normal.     Left Ear: External ear normal.     Nose: Nose normal.  Eyes:     General: No scleral icterus.    Conjunctiva/sclera: Conjunctivae normal.  Cardiovascular:     Rate and Rhythm: Normal rate and regular rhythm.     Pulses:          Radial pulses are 1+ on the right side and 1+ on the left side.       Dorsalis pedis pulses are 1+ on the right side and 1+ on the left side.     Heart sounds: Normal heart sounds.  Pulmonary:     Effort: Pulmonary effort is normal.     Breath sounds: Normal breath sounds.  Abdominal:     Palpations: Abdomen is soft.     Tenderness: There is no abdominal tenderness.     Comments: Obese. Soft. Non tender. No suprapubic or CVA tenderness   Musculoskeletal:        General: No deformity. Normal range of motion.     Cervical back: Normal range of motion and neck supple.  Skin:    General: Skin is warm and dry.     Capillary Refill: Capillary refill takes less than 2 seconds.  Neurological:     Mental Status: She is alert and oriented to person, place, and time.     Comments:   Mental Status: Patient is awake, alert, oriented to person, place, year, and situation. Patient is able to give a clear and coherent history. Speech is  fluent and clear without dysarthria or aphasia. No signs of neglect.  Cranial Nerves: I not tested II visual fields full bilaterally. PERRL.   III, IV, VI EOMs intact without ptosis V sensation to light touch intact in all 3 divisions of trigeminal nerve bilaterally  VII facial movements symmetric bilaterally VIII  hearing intact to voice/conversation  IX, X no uvula deviation, symmetric rise of soft palate/uvula XI 5/5 SCM and trapezius strength bilaterally  XII tongue protrusion midline, symmetric L/R movements  Motor: Strength 5/5 in upper/lower extremities.  Sensation to light touch intact in face, upper/lower extremities. No pronator drift. No leg drop.  Cerebellar: No ataxia with finger to nose. Steady gait  Psychiatric:        Behavior: Behavior normal.        Thought Content: Thought content normal.        Judgment: Judgment normal.     ED Results / Procedures / Treatments   Labs (all labs ordered are listed, but only abnormal results are displayed) Labs Reviewed  CBC WITH DIFFERENTIAL/PLATELET - Abnormal; Notable for the following components:      Result Value   Hemoglobin 8.1 (*)    HCT 29.5 (*)    MCV 68.8 (*)    MCH 18.9 (*)    MCHC 27.5 (*)    RDW 19.2 (*)    All other components within normal limits  COMPREHENSIVE METABOLIC PANEL - Abnormal; Notable for the following components:   Potassium 3.4 (*)    Calcium 8.8 (*)    All other components within normal limits  URINALYSIS, ROUTINE W REFLEX MICROSCOPIC - Abnormal; Notable for the following components:   Color, Urine COLORLESS (*)    Specific Gravity, Urine 1.004 (*)    All other components within normal limits  LIPASE, BLOOD  I-STAT BETA HCG BLOOD, ED ([facility], [facility], [facility] ONLY)    EKG EKG Interpretation  Date/Time:  Thursday April 11 2020 19:40:57 EST Ventricular Rate:  86 PR Interval:    QRS Duration: 89 QT Interval:  398 QTC Calculation: 476 R Axis:   79 Text Interpretation: Sinus rhythm Ventricular  premature complex Low voltage, precordial leads Borderline prolonged QT interval No old tracing to compare Confirmed by Lorre NickAllen, Anthony ([id number]) on 04/11/2020 8:28:17 PM   Radiology CT Head Wo Contrast  Result Date: 04/11/2020 CLINICAL DATA:  Headache with elevated blood pressure EXAM: CT HEAD WITHOUT CONTRAST TECHNIQUE: Contiguous axial images were obtained from the base of the skull through the vertex without intravenous contrast. COMPARISON:  CT 10/26/2012 FINDINGS: Brain: No evidence of acute infarction, hemorrhage, hydrocephalus, extra-axial collection or mass lesion/mass effect. Vascular: No hyperdense vessel or unexpected calcification. Skull: Normal. Negative for fracture or focal lesion. Sinuses/Orbits: No acute finding. Other: None IMPRESSION: Negative non contrasted CT appearance of the brain. Electronically Signed   By: Jasmine PangKim  Fujinaga M.D.   On: 04/11/2020 21:09    Procedures Procedures (including critical care time)  Medications Ordered in ED Medications  amLODipine (NORVASC) tablet 5 mg (5 mg Oral Given 04/11/20 2059)  cloNIDine (CATAPRES) tablet 0.1 mg (0.1 mg Oral Given 04/11/20 2059)    ED Course  I have reviewed the triage vital signs and the nursing notes.  Pertinent labs & imaging results that were available during my care of the patient were reviewed by me and considered in my medical decision making (see chart for details).  Clinical Course as of Apr 11 2233  Thu Apr 11, 2020  2116 Hemoglobin(!): 8.1 [CG]  2116 HCT(!): 29.5 [CG]  2116 Potassium(!): 3.4 [CG]  2116  IMPRESSION: Negative non contrasted CT appearance of the brain    CT Head Wo Contrast [CG]  2117 Sinus rhythm Ventricular premature complex Low voltage, precordial leads Borderline prolonged QT interval No old tracing to compare Confirmed by Lorre NickAllen, Anthony ([id number]) on 04/11/2020  8:28:17 PM  EKG 12-Lead [CG]  2135 Reevaluated patient.  Discussed benign work-up.  Discussed hemoglobin being 8.1.   States she does have occasionally very heavy periods but sometimes they will be normal flow.  Some months her flow is very heavy and she cannot really leave the house.  Her last menstrual period was 3 weeks ago and thinks it was heavier than usual.  Denies any other acute bleeding.  Denies chest pain, shortness of breath, lightheadedness, fatigue.  Has been told in the past that she is anemic but does not know her baseline hemoglobin.  Retracted BP and it was 165 systolic, much improved.  While we were talking blood pressure cuff started pumping and patient was moving and bending her arm, SBP was 204, will wait 5 minutes and recheck.   [CG]    Clinical Course User Index [CG] Jerrell Mylar   MDM Rules/Calculators/A&P                         34 year old female with previously diagnosed hypertension, noncompliant with amlodipine, cocaine recreational use presents to the ED for elevated blood pressure reading, transient/fleeting flutters and head fullness.  Also reports intermittent abdominal and right low back pain with constipation.  EMR, triage or nurse notes reviewed to obtain more history and MDM  Seen 1 year ago at urgent care for abdominal symptoms/vaginal discharged and noted to be hypertensive and discharged with amlodipine, noncompliant.  Her flow sheets shows BPs ranging in the 180/110s.  On arrival blood pressure is 184/124 and as high as 204/134.  Will evaluate for hypertensive emergency given elevated BP, cocaine use and ?head discomfort "fullness".    Labs ordered: CBC, CMP, lipase, urinalysis, hCG  Imaging: CT head, EKG  Lab work and imaging personally visualized and interpreted  Labs reveal-hemoglobin 8.1, HCT 29.4.  Normal creatinine, LFTs.  Urinalysis without infection.  Patient states prior diagnosis of anemia but unknown baseline.  Reports intermittently heavy menses but no active bleeding.  No symptoms of anemia.  Will recommend recheck by OB/GYN or PCP.  Return  precautions specifically of symptomatic anemia or heavy bleeding discussed with patient.  Imaging reveals-nonacute CT without any intracranial hemorrhage, EKG without ischemic changes, arrhythmias.  She has been on cardiac monitor while in the ED without events.  Medicines given: Amlodipine   2230: Patient evaluated by EDP, recommended clonidine.  Patient reevaluated.  BP improved after medicines on 177/98.  Repeat abdominal exam without any tenderness.  Suspect constipation related abdominal pain.  ER work up not consistent with hypertensive emergency. CT head negative. No end organ damage on labs. She has no CP, SOB to warrant further cardiac work up.   We will discharge patient with amlodipine daily, OB/GYN follow-up for anemia/menorrhagia. Stressed important of cocaine cessation.   Final Clinical Impression(s) / ED Diagnoses Final diagnoses:  Low hemoglobin  Elevated blood pressure reading with diagnosis of hypertension  Medical non-compliance  Recreational drug use    Rx / DC Orders ED Discharge Orders         Ordered    amLODipine (NORVASC) 5 MG tablet  Daily        04/11/20 2138           Jerrell Mylar 04/11/20 2234    Lorre Nick, MD 04/12/20 [phone]

## 2020-04-11 NOTE — ED Provider Notes (Signed)
Medical screening examination/treatment/procedure(s) were conducted as a shared visit with non-physician practitioner(s) and myself.  I personally evaluated the patient during the encounter.  EKG Interpretation  Date/Time:  Thursday April 11 2020 19:40:57 EST Ventricular Rate:  86 PR Interval:    QRS Duration: 89 QT Interval:  398 QTC Calculation: 476 R Axis:   79 Text Interpretation: Sinus rhythm Ventricular premature complex Low voltage, precordial leads Borderline prolonged QT interval No old tracing to compare Confirmed by Lorre Nick ([id number]) on 04/11/2020 8:39:77 PM  34 year old female presents with high blood pressure. Has had lower extremity discomfort with some associated heart fluttering. Will treat blood pressure here with clonidine and likely discharge   Lorre Nick, MD 04/11/20 2101

## 2020-04-11 NOTE — ED Notes (Signed)
Will discharge pt, await paperwork and speaking with PA

## 2020-04-11 NOTE — ED Triage Notes (Signed)
Patient arrives complaining of HTN. Patient states she was prescribed BP medications 6 months ago, but did not pick it up because she did not think it was serious. Patient states regular cocaine, alcohol and nicotine use. Last use yesterday of alcohol and cocaine. Patient states her head feels full.

## 2020-04-11 NOTE — Discharge Instructions (Signed)
You were seen in the ER for elevated blood pressure reading  Your blood pressure improved after oral medicines  CT of head was normal  Your urinalysis was normal - no infection. Cause of intermittent abdominal pain is unclear, may be related to constipation. Continue using daily stool softener or laxative to regular your bowel movements.  Return for constant, sever abdominal pain, fever, nausea, vomiting  Lab work showed low hemoglobin 8.1.  This is likely from occasionally heavy periods.  Make appointment with OBGYN or primary care doctor for hemoglobin re-check and discussion of heavy periods. Return to ER for chest pain or shortness of breath with exertion, extreme fatigue, light headedness, passing out  You need to take a daily blood pressure medicine - take amlodipine daily. Cocaine will increase your blood pressure.

## 2020-04-11 NOTE — ED Notes (Signed)
Pt continues to be hypertensive, await hearing from PA regarding discharge

## 2021-03-28 IMAGING — CT CT HEAD W/O CM
3 series · 15 of 45 positions shown, 18 images · non-contrast
Comparison: CT 10/26/2012

CLINICAL DATA: Headache with elevated blood pressure

EXAM:
CT HEAD WITHOUT CONTRAST
TECHNIQUE: Contiguous axial images were obtained from the base of the skull
through the vertex without intravenous contrast.

[Series 3: head wo · axial · 0.47mm/px · z∈[+1604,+1719]mm · 9 of 28 slices shown, 12 images]
[im 3/28  brain]
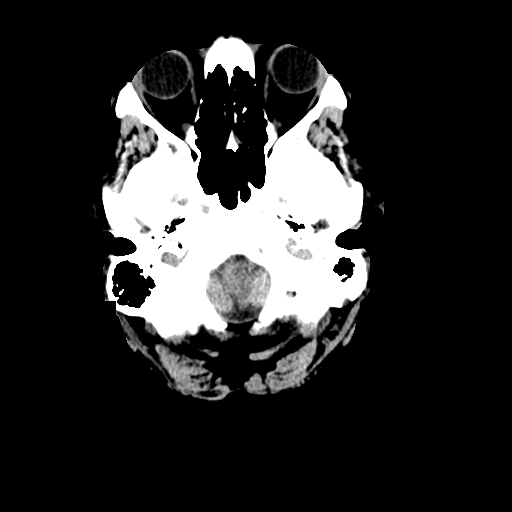
[im 3/28  bone]
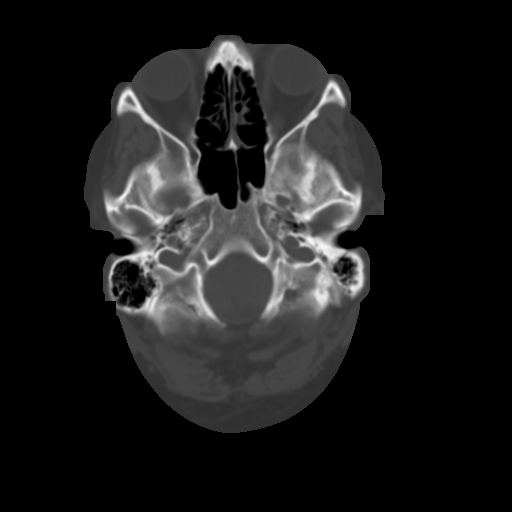
[im 6/28  brain]
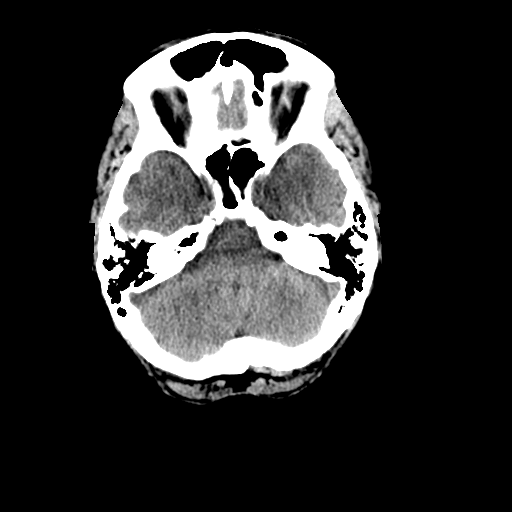
[im 9/28  brain]
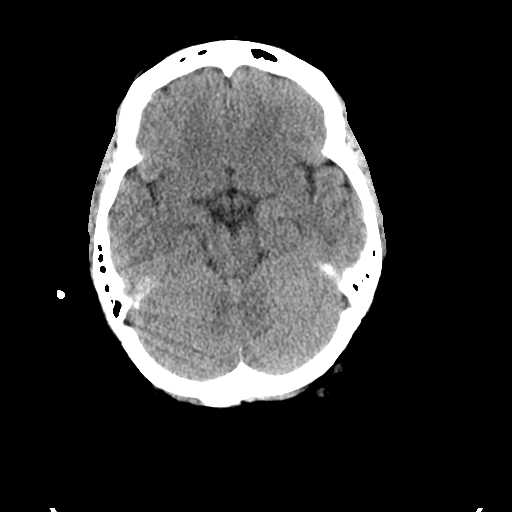
[im 12/28  brain]
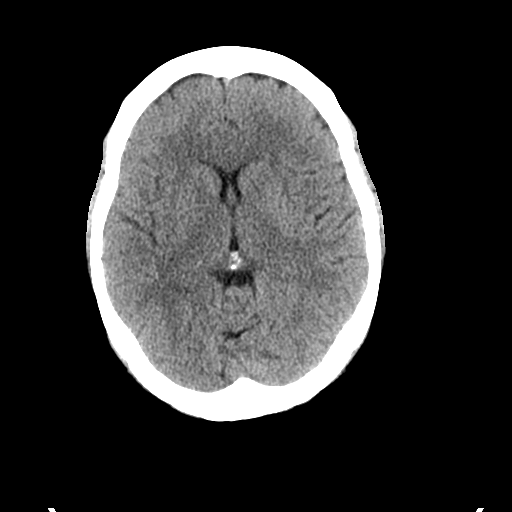
[im 15/28  brain]
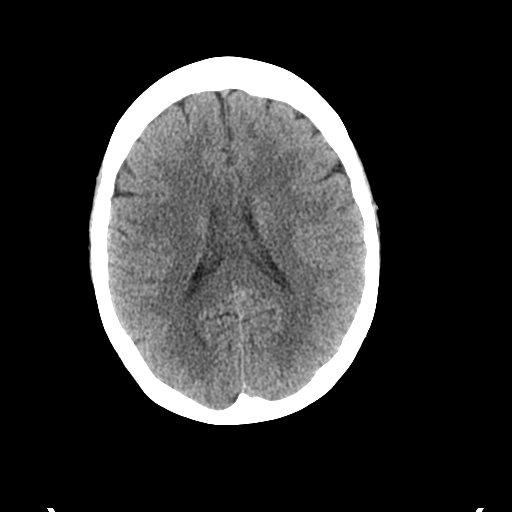
[im 15/28  bone]
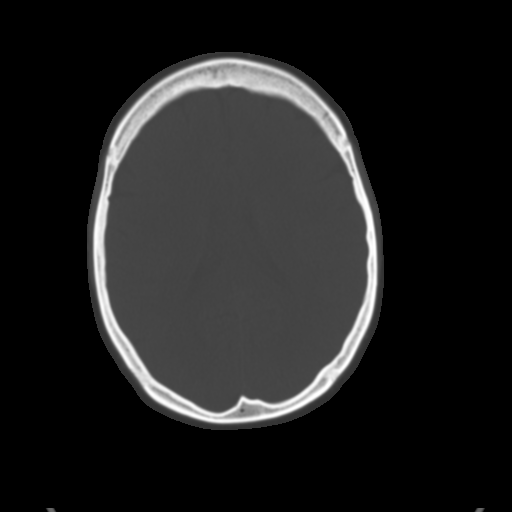
[im 17/28  brain]
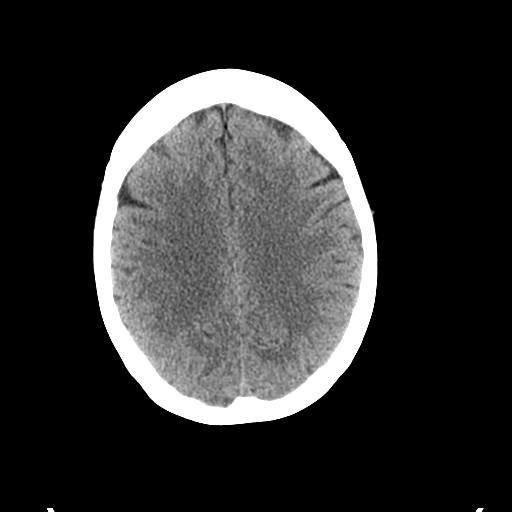
[im 20/28  brain]
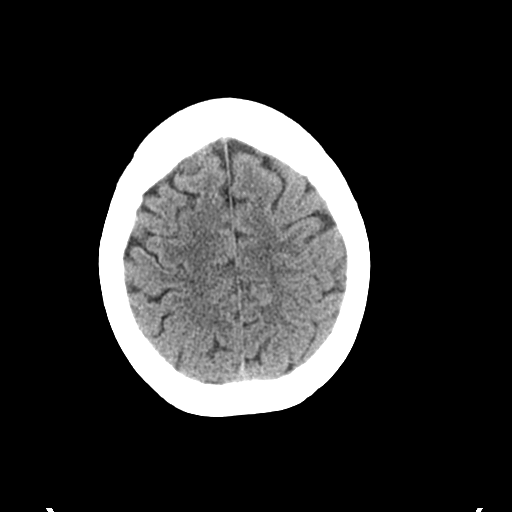
[im 23/28  brain]
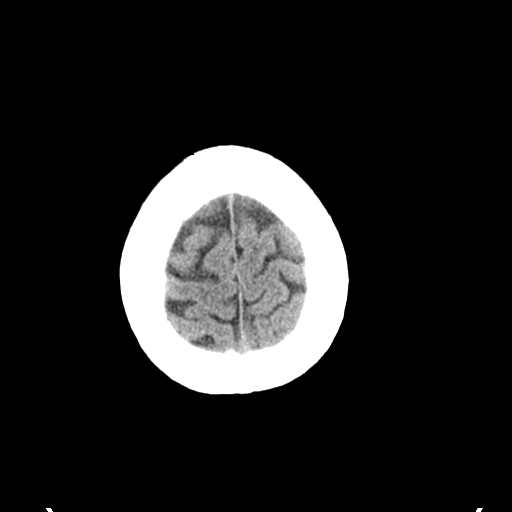
[im 26/28  brain]
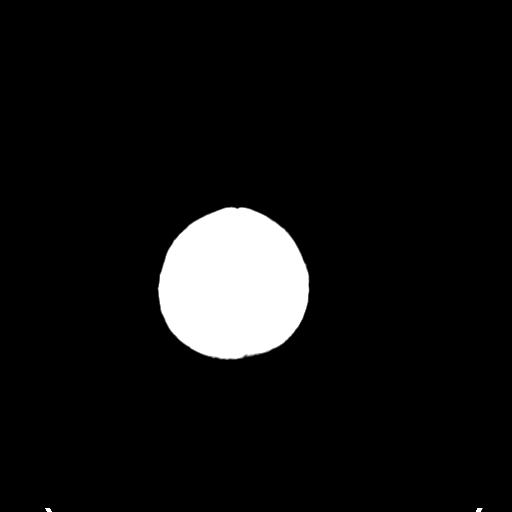
[im 26/28  bone]
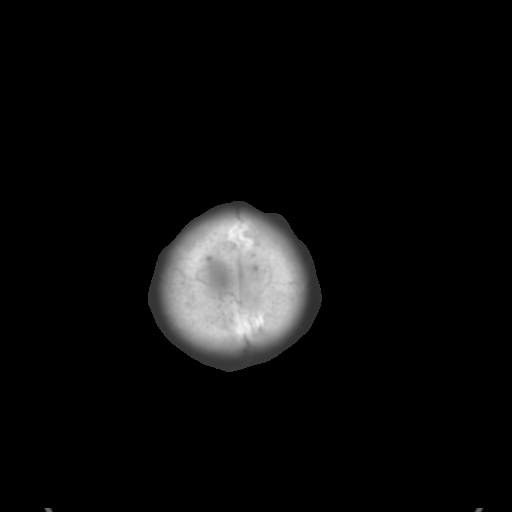

[Series 5: coronal soft tissue · coronal · 0.29mm/px · 3 of 65 slices shown]
[im 22/65  brain]
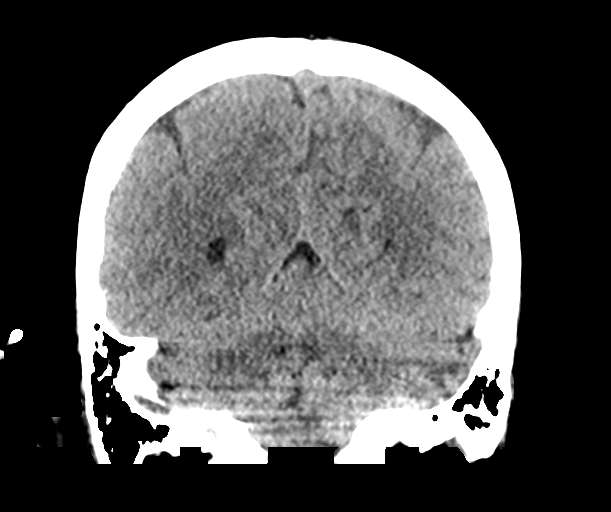
[im 29/65  brain]
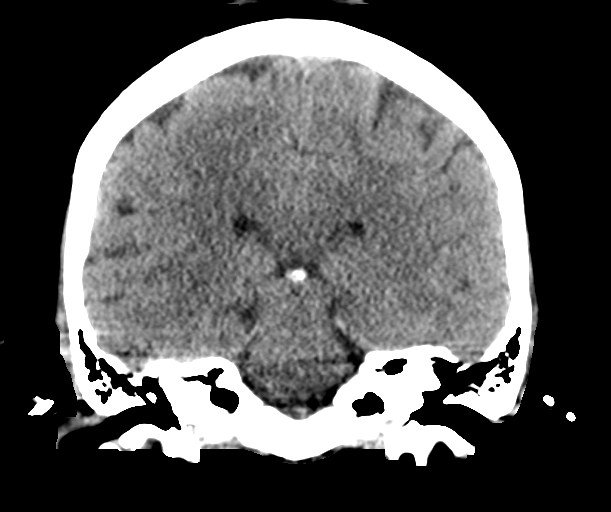
[im 36/65  brain]
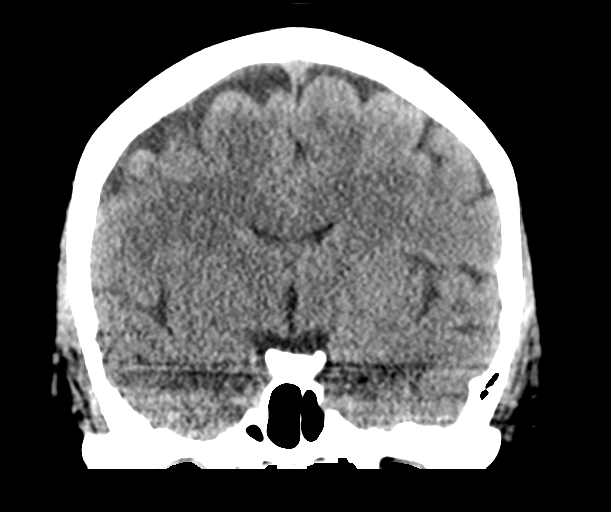

[Series 6: sagittal soft tissue · sagittal · 0.29mm/px · 3 of 60 slices shown]
[im 20/60  brain]
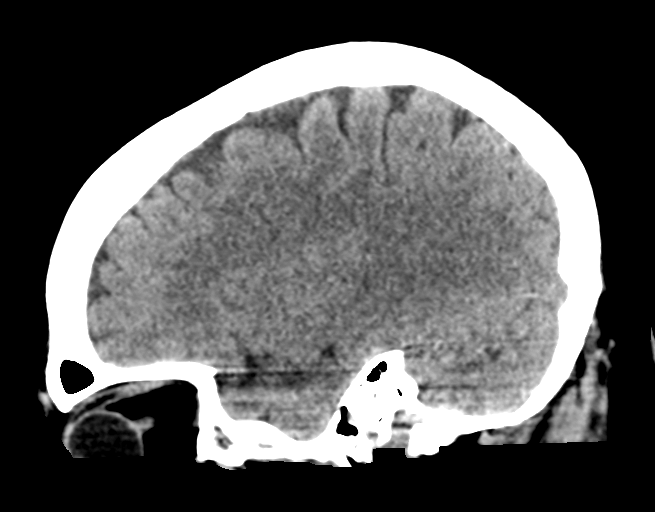
[im 30/60  brain]
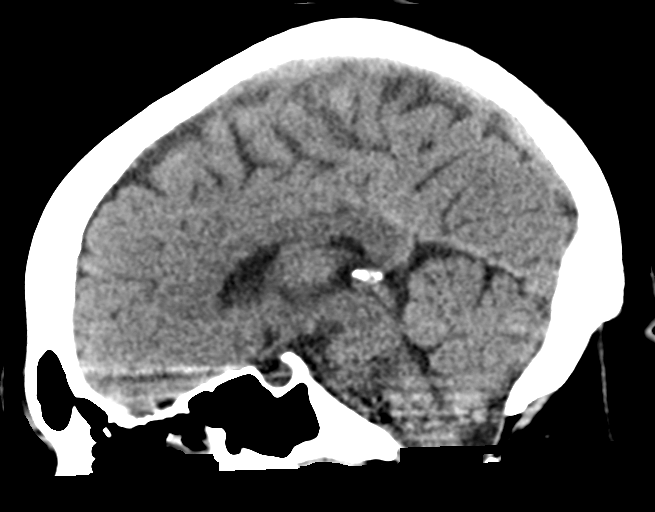
[im 40/60  brain]
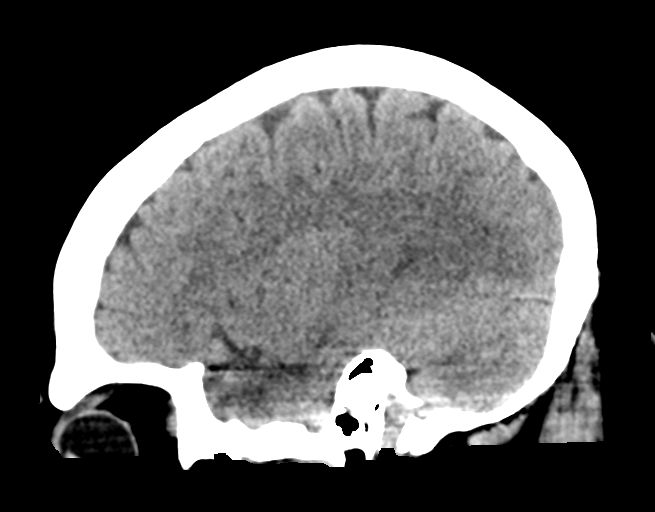

[15 of 45 positions shown; findings below may reference images not displayed]

FINDINGS: Brain: No evidence of acute infarction, hemorrhage, hydrocephalus,
extra-axial collection or mass lesion/mass effect.

Vascular: No hyperdense vessel or unexpected calcification.

Skull: Normal. Negative for fracture or focal lesion.

Sinuses/Orbits: No acute finding.

Other: None
IMPRESSION: Negative non contrasted CT appearance of the brain.

## 2021-07-04 ENCOUNTER — Ambulatory Visit (HOSPITAL_COMMUNITY): Payer: Self-pay

## 2021-07-10 ENCOUNTER — Ambulatory Visit (HOSPITAL_COMMUNITY): Payer: Self-pay

## 2021-08-12 ENCOUNTER — Ambulatory Visit (HOSPITAL_COMMUNITY): Payer: Self-pay

## 2021-08-15 ENCOUNTER — Ambulatory Visit (HOSPITAL_COMMUNITY)
Admission: RE | Admit: 2021-08-15 | Discharge: 2021-08-15 | Disposition: A | Discharge status: Home / Self Care | Payer: Self-pay | Source: Ambulatory Visit | Attending: Internal Medicine | Admitting: Internal Medicine

## 2021-08-15 ENCOUNTER — Encounter (HOSPITAL_COMMUNITY): Payer: Self-pay | Admitting: Internal Medicine

## 2021-08-15 ENCOUNTER — Telehealth (HOSPITAL_COMMUNITY): Payer: Self-pay | Admitting: Internal Medicine

## 2021-08-15 ENCOUNTER — Encounter (HOSPITAL_COMMUNITY): Payer: Self-pay

## 2021-08-15 VITALS — BP 141/99 | HR 71 | Temp 97.6°F | Resp 18

## 2021-08-15 DIAGNOSIS — N739 Female pelvic inflammatory disease, unspecified: Secondary | ICD-10-CM

## 2021-08-15 DIAGNOSIS — Z113 Encounter for screening for infections with a predominantly sexual mode of transmission: Secondary | ICD-10-CM

## 2021-08-15 LAB — POCT URINALYSIS DIPSTICK, ED / UC
Bilirubin Urine: NEGATIVE
Glucose, UA: NEGATIVE mg/dL
Ketones, ur: NEGATIVE mg/dL
Leukocytes,Ua: NEGATIVE
Nitrite: NEGATIVE
Protein, ur: NEGATIVE mg/dL
Specific Gravity, Urine: 1.01 (ref 1.005–1.030)
Urobilinogen, UA: 0.2 mg/dL (ref 0.0–1.0)
pH: 6.5 (ref 5.0–8.0)

## 2021-08-15 LAB — POC URINE PREG, ED: Preg Test, Ur: NEGATIVE

## 2021-08-15 MED ORDER — LIDOCAINE HCL (PF) 1 % IJ SOLN
INTRAMUSCULAR | Status: AC
  Filled 2021-08-15: qty 2

## 2021-08-15 MED ORDER — METRONIDAZOLE 500 MG PO TABS: 500.0000 mg | ORAL_TABLET | Freq: Two times a day (BID) | ORAL | 0 refills | Status: AC

## 2021-08-15 MED ORDER — FLUCONAZOLE 150 MG PO TABS: 150.0000 mg | ORAL_TABLET | Freq: Every day | ORAL | 0 refills | Status: AC

## 2021-08-15 MED ORDER — CEFTRIAXONE SODIUM 500 MG IJ SOLR
500.0000 mg | Freq: Once | INTRAMUSCULAR | Status: AC
  Administered 2021-08-15: 500 mg via INTRAMUSCULAR

## 2021-08-15 MED ORDER — CEFTRIAXONE SODIUM 500 MG IJ SOLR
INTRAMUSCULAR | Status: AC
  Filled 2021-08-15: qty 500

## 2021-08-15 MED ORDER — DOXYCYCLINE HYCLATE 100 MG PO TABS: 100.0000 mg | ORAL_TABLET | Freq: Two times a day (BID) | ORAL | 0 refills | Status: DC

## 2021-08-15 NOTE — ED Provider Notes (Signed)
?[facility] ? ? ? ?CSN: [id number] ?Arrival date & time: 08/15/21  1617 ? ? ?  ? ?History   ?Chief Complaint ?Chief Complaint  ?Patient presents with  ? Urinary Frequency  ?  Entered by patient  ? ? ?HPI ?Amanda CrookMary K Nunez is a 36 y.o. female with history of hypertension presents to urgent care today with urinary and pelvic complaints.  Patient reports bilateral lower abdominal cramping x2 weeks with urinary frequency starting earlier this week.  States urine was initially cloudy but now normal.  She also reports increased vaginal discharge, cloudy in nature with no odor.  She does report some itching.  LMP early March within normal limits.  Patient does admit to recent unprotected sex and scant amount of vaginal bleeding after sex.  She denies any recent fever, chills, gross hematuria, abdominal pain, nausea, vomiting, pain.  Patient denies history of kidney stones. ? ? ?Past Medical History:  ?Diagnosis Date  ? Gallstones   ? Hypertension   ? UTI (lower urinary tract infection)   ? ? ?There are no problems to display for this patient. ? ? ?Past Surgical History:  ?Procedure Laterality Date  ? CHOLECYSTECTOMY  10/31/2011  ? Procedure: LAPAROSCOPIC CHOLECYSTECTOMY WITH INTRAOPERATIVE CHOLANGIOGRAM;  Surgeon: Wilmon ArmsMatthew K. Corliss Skainssuei, MD;  Location: [facility];  Service: General;  Laterality: N/A;  ? gallstones    ? ? ?OB History   ? ? Gravida  ?2  ? Para  ?2  ? Term  ?   ? Preterm  ?   ? AB  ?   ? Living  ?   ?  ? ? SAB  ?   ? IAB  ?   ? Ectopic  ?   ? Multiple  ?   ? Live Births  ?   ?   ?  ?  ? ? ? ?Home Medications   ? ?Prior to Admission medications   ?Medication Sig Start Date End Date Taking? Authorizing Provider  ?doxycycline (VIBRA-TABS) 100 MG tablet Take 1 tablet (100 mg total) by mouth 2 (two) times daily for 14 days. 08/15/21 08/29/21 Yes Rolla EtienneSmith, Timithy Arons E, NP  ?metroNIDAZOLE (FLAGYL) 500 MG tablet Take 1 tablet (500 mg total) by mouth 2 (two) times daily for 14 days. 08/15/21 08/29/21 Yes Rolla EtienneSmith, Ramandeep Arington E, NP   ?amLODipine (NORVASC) 5 MG tablet Take 1 tablet (5 mg total) by mouth daily. 04/11/20 05/11/20  Liberty HandyGibbons, Claudia J, PA-C  ?aspirin-acetaminophen-caffeine (EXCEDRIN MIGRAINE) [id number] MG tablet Take 2 tablets by mouth every 6 (six) hours as needed for headache.    [provider]  ?fluticasone (FLONASE) 50 MCG/ACT nasal spray Place 1-2 sprays into both nostrils daily as needed for allergies or rhinitis.    [provider]  ?polyvinyl alcohol (LIQUIFILM TEARS) 1.4 % ophthalmic solution Place 1 drop into both eyes as needed for dry eyes.    [provider]  ?levonorgestrel (MIRENA) 20 MCG/24HR IUD 1 each by Intrauterine route once.  04/15/19  [provider]  ?Norethindrone Acetate-Ethinyl Estrad-FE (LOESTRIN 24 FE) 1-20 MG-MCG(24) tablet Take 1 tablet by mouth daily. 11/24/12 04/15/19  Brock BadHarper, Charles A, MD  ? ? ?Family History ?Family History  ?Problem Relation Age of Onset  ? Multiple sclerosis Mother   ? ? ?Social History ?Social History  ? ?Tobacco Use  ? Smoking status: Some Days  ?  Packs/day: 0.50  ?  Types: Cigarettes  ? Smokeless tobacco: Never  ?Vaping Use  ? Vaping Use: Never used  ?Substance  Use Topics  ? Alcohol use: Yes  ?  Comment: heavy daily drinker  ? Drug use: Yes  ?  Types: Cocaine  ?  Comment: hx "all of them"  ? ? ? ?Allergies   ?Patient has no known allergies. ? ? ?Review of Systems ?As stated in HPI otherwise negative ? ? ?Physical Exam ?Triage Vital Signs ?ED Triage Vitals  ?Enc Vitals Group  ?   BP 08/15/21 1643 (!) 141/99  ?   Pulse Rate 08/15/21 1643 71  ?   Resp 08/15/21 1643 18  ?   Temp 08/15/21 1643 97.6 ?F (36.4 ?C)  ?   Temp Source 08/15/21 1643 Oral  ?   SpO2 08/15/21 1643 100 %  ?   Weight --   ?   Height --   ?   Head Circumference --   ?   Peak Flow --   ?   Pain Score 08/15/21 1641 4  ?   Pain Loc --   ?   Pain Edu? --   ?   Excl. in GC? --   ? ?No data found. ? ?Updated Vital Signs ?BP (!) 141/99 (BP Location: Left Arm) Comment: does not  take BP meds  Pulse 71   Temp 97.6 ?F (36.4 ?C) (Oral)   Resp 18   LMP 07/21/2021 (Approximate)   SpO2 100%  ? ?Visual Acuity ?Right Eye Distance:   ?Left Eye Distance:   ?Bilateral Distance:   ? ?Right Eye Near:   ?Left Eye Near:    ?Bilateral Near:    ? ?Physical Exam ?Exam conducted with a chaperone present.  ?Constitutional:   ?   General: She is not in acute distress. ?   Appearance: Normal appearance. She is obese. She is not ill-appearing or toxic-appearing.  ?HENT:  ?   Mouth/Throat:  ?   Mouth: Mucous membranes are moist.  ?Eyes:  ?   Extraocular Movements: Extraocular movements intact.  ?Cardiovascular:  ?   Rate and Rhythm: Normal rate and regular rhythm.  ?Pulmonary:  ?   Effort: Pulmonary effort is normal.  ?   Breath sounds: Normal breath sounds.  ?Abdominal:  ?   General: Bowel sounds are normal. There is no distension.  ?   Palpations: Abdomen is soft.  ?   Tenderness: There is abdominal tenderness. There is no right CVA tenderness, left CVA tenderness, guarding or rebound.  ?Genitourinary: ?   General: Normal vulva.  ?   Vagina: No vaginal discharge.  ?   Comments: + Cervical motion tenderness and mild adnexal tenderness bilaterally.  No unusual vaginal discharge.  No suspicious lesions ?Skin: ?   General: Skin is warm and dry.  ?Neurological:  ?   General: No focal deficit present.  ?   Mental Status: She is alert and oriented to person, place, and time.  ?Psychiatric:     ?   Mood and Affect: Mood normal.     ?   Behavior: Behavior normal.  ? ? ? ?UC Treatments / Results  ?Labs ?(all labs ordered are listed, but only abnormal results are displayed) ?Labs Reviewed  ?POCT URINALYSIS DIPSTICK, ED / UC - Abnormal; Notable for the following components:  ?    Result Value  ? Hgb urine dipstick TRACE (*)   ? All other components within normal limits  ?URINE CULTURE  ?POC URINE PREG, ED  ?CERVICOVAGINAL ANCILLARY ONLY  ? ? ?EKG ? ? ?Radiology ?No results found. ? ?Procedures ?Procedures (including  critical care time) ? ?Medications Ordered in UC ?Medications  ?cefTRIAXone (ROCEPHIN) injection 500 mg (has no administration in time range)  ? ? ?Initial Impression / Assessment and Plan / UC Course  ?I have reviewed the triage vital signs and the nursing notes. ? ?Pertinent labs & imaging results that were available during my care of the patient were reviewed by me and considered in my medical decision making (see chart for details). ? ?PID ?-Based on symptom complaints, pelvic exam was performed. Patient with CMT and some mild adnexal tenderness. Also  with recent scant bleeding after unprotected intercourse.  ?-STI screening  ?-IM Rocephin, Doxy BID/Flagyl BID x 14d ?-Send urine for culture ?-f/u for any persistent or worsening symptoms ? ?Reviewed expections re: course of current medical issues. Questions answered. ?Outlined signs and symptoms indicating need for more acute intervention. ?Pt verbalized understanding. ?AVS given ? ?Final Clinical Impressions(s) / UC Diagnoses  ? ?Final diagnoses:  ?Female pelvic inflammatory disease  ?Routine screening for STI (sexually transmitted infection)  ? ? ? ?Discharge Instructions   ? ?  ?I am treating you today with 3 different medications. You received 1 in the office. You will pick the other 2 up at the pharmacy and take those as prescribed.  The results of your STD testing and urine culture should be available in 3 to 4 days.  We will notify you should you require any further treatment.  In the meantime, should your symptoms worsen or should you develop any fever you will need to be seen immediately.  I have given you a referral number for a women's health practitioner should you want to establish. ? ? ? ? ?ED Prescriptions   ? ? Medication Sig Dispense Auth. Provider  ? doxycycline (VIBRA-TABS) 100 MG tablet Take 1 tablet (100 mg total) by mouth 2 (two) times daily for 14 days. 28 tablet Rolla Etienne, NP  ? metroNIDAZOLE (FLAGYL) 500 MG tablet Take 1 tablet (500  mg total) by mouth 2 (two) times daily for 14 days. 28 tablet Rolla Etienne, NP  ? ?  ? ?PDMP not reviewed this encounter. ?  ?Rolla Etienne, NP ?08/15/21 1759 ? ?

## 2021-08-15 NOTE — ED Triage Notes (Signed)
Pt reports a few weeks h/o dysuria, lower abdominal pain, urinary frequency and vaginal discharge. Has been taking AZO.  ?

## 2021-08-15 NOTE — Discharge Instructions (Addendum)
I am treating you today with 3 different medications. You received 1 in the office. You will pick the other 2 up at the pharmacy and take those as prescribed.  The results of your STD testing and urine culture should be available in 3 to 4 days.  We will notify you should you require any further treatment.  In the meantime, should your symptoms worsen or should you develop any fever you will need to be seen immediately.  I have given you a referral number for a women's health practitioner should you want to establish. ?

## 2021-08-17 LAB — URINE CULTURE: Culture: <10000 — AB

## 2021-08-19 ENCOUNTER — Encounter (HOSPITAL_COMMUNITY): Payer: Self-pay | Admitting: Emergency Medicine

## 2021-08-19 ENCOUNTER — Other Ambulatory Visit: Payer: Self-pay

## 2021-08-19 ENCOUNTER — Ambulatory Visit (HOSPITAL_COMMUNITY)
Admission: EM | Admit: 2021-08-19 | Discharge: 2021-08-19 | Disposition: A | Discharge status: Home / Self Care | Payer: Self-pay | Attending: Family Medicine | Admitting: Family Medicine

## 2021-08-19 DIAGNOSIS — R102 Pelvic and perineal pain: Secondary | ICD-10-CM | POA: Insufficient documentation

## 2021-08-19 LAB — CERVICOVAGINAL ANCILLARY ONLY

## 2021-08-19 MED ORDER — DOXYCYCLINE HYCLATE 100 MG PO CAPS: 100.0000 mg | ORAL_CAPSULE | Freq: Two times a day (BID) | ORAL | 0 refills | Status: AC

## 2021-08-19 MED ORDER — FLUCONAZOLE 150 MG PO TABS: 150.0000 mg | ORAL_TABLET | Freq: Once | ORAL | 0 refills | Status: AC

## 2021-08-19 NOTE — ED Triage Notes (Signed)
Amanda Fujita, RN sent message that patient is to have a specimen recollected.  Spoke with dr hagler about new order for today's specimen.   ? ?Gave patient instructions on specimen collection  ?

## 2021-08-19 NOTE — Telephone Encounter (Signed)
Patient states she was not able  pick up Doxycycline.  Called pharmacy, and they state they did not receive the prescription.   ?Resending and patient aware ?

## 2021-08-20 LAB — CERVICOVAGINAL ANCILLARY ONLY
Bacterial Vaginitis (gardnerella): POSITIVE — AB
Candida Glabrata: NEGATIVE
Candida Vaginitis: NEGATIVE
Chlamydia: NEGATIVE
Comment: NEGATIVE
Comment: NEGATIVE
Comment: NEGATIVE
Comment: NEGATIVE
Comment: NEGATIVE
Comment: NORMAL
Neisseria Gonorrhea: NEGATIVE
Trichomonas: NEGATIVE

## 2022-07-21 ENCOUNTER — Encounter (HOSPITAL_COMMUNITY): Payer: Self-pay

## 2022-07-21 ENCOUNTER — Ambulatory Visit (INDEPENDENT_AMBULATORY_CARE_PROVIDER_SITE_OTHER): Payer: Self-pay

## 2022-07-21 ENCOUNTER — Ambulatory Visit (HOSPITAL_COMMUNITY)
Admission: RE | Admit: 2022-07-21 | Discharge: 2022-07-21 | Disposition: A | Discharge status: Home / Self Care | Payer: Self-pay | Source: Ambulatory Visit | Attending: Family Medicine | Admitting: Family Medicine

## 2022-07-21 VITALS — BP 140/93 | HR 73 | Temp 98.3°F | Resp 19

## 2022-07-21 DIAGNOSIS — W19XXXA Unspecified fall, initial encounter: Secondary | ICD-10-CM

## 2022-07-21 DIAGNOSIS — M79672 Pain in left foot: Secondary | ICD-10-CM

## 2022-07-21 DIAGNOSIS — S93602A Unspecified sprain of left foot, initial encounter: Secondary | ICD-10-CM

## 2022-07-21 DIAGNOSIS — M25572 Pain in left ankle and joints of left foot: Secondary | ICD-10-CM

## 2022-07-21 MED ORDER — IBUPROFEN 800 MG PO TABS
ORAL_TABLET | ORAL | Status: AC
  Filled 2022-07-21: qty 1

## 2022-07-21 MED ORDER — IBUPROFEN 800 MG PO TABS
800.0000 mg | ORAL_TABLET | Freq: Once | ORAL | Status: AC
  Administered 2022-07-21: 800 mg via ORAL

## 2022-07-21 NOTE — Discharge Instructions (Signed)
If not allergic, you may use over the counter ibuprofen or acetaminophen as needed. ° °

## 2022-07-21 NOTE — ED Triage Notes (Signed)
Pt presents after falling from standing up last night and hurting her left ankle.

## 2022-07-21 NOTE — ED Provider Notes (Incomplete)
[facility]   [id number] 07/21/22 Arrival Time: [id number]  ASSESSMENT & PLAN:  1. Acute foot pain, left   2. Acute left ankle pain   3. Sprain of left foot, initial encounter    I have personally viewed the imaging studies ordered this visit. No acute bony changes appreciated on LEFT foot/ankle imaging today. See radiology report. Discussed with pt.  OTC ibuprofen '800mg'$  TID with food.  Orders Placed This Encounter  Procedures   DG Ankle Complete Left   DG Foot Complete Left   Apply CAM boot   Crutches   CAM boot/crutches given. Work/school excuse note: provided.  Recommend:  Follow-up Information     Schedule an appointment as soon as possible for a visit  with [facility]).   Why: If worsening or failing to improve as anticipated. Contact information: [address] [place],  [place]  [address]  [phone]                Reviewed expectations re: course of current medical issues. Questions answered. Outlined signs and symptoms indicating need for more acute intervention. Patient verbalized understanding. After Visit Summary given.  SUBJECTIVE: History from: patient. ROSAISELA Nunez is a 37 y.o. female who reports {Blank single:22868} {DEGREE - MILD, MOD, SEV:20224} pain of her {DESC; LEFT/RIGHT/BI:30031} ***; described as {quality:618}; {PAIN RADIATION:23213}. Onset: {abrupt, gradual:20671}. First noted: {$Time; disease onset (duration):[phone]}. Injury/trama: {YES/NO/WILD RC:[id number]. Symptoms have {$Desc; symptom progression (context):[phone]} since beginning. Aggravating factors: weight bearing. Alleviating factors:  elevating foot . Associated symptoms: none reported. Extremity sensation changes or weakness: none. No tx PTA.  Past Surgical History:  Procedure Laterality Date   CHOLECYSTECTOMY  10/31/2011   Procedure: LAPAROSCOPIC CHOLECYSTECTOMY WITH INTRAOPERATIVE CHOLANGIOGRAM;  Surgeon: Imogene Burn. Tsuei,  MD;  Location: [facility];  Service: General;  Laterality: N/A;   gallstones        OBJECTIVE:  Vitals:   07/21/22 1537  BP: (!) 140/93  Pulse: 73  Resp: 19  Temp: 98.3 F (36.8 C)  SpO2: 99%    General appearance: alert; no distress HEENT: [place]; AT Neck: supple with FROM Resp: unlabored respirations Extremities: LLE: warm with well perfused appearance; poorly localized moderate tenderness over left dorsal foot extending into medial ankle distribution; without gross deformities; swelling: minimal; bruising: none; ankle and all toes with FROM CV: brisk extremity capillary refill of LLE; 2+ DP pulse of LLE. Skin: warm and dry; no visible rashes Neurologic: normal sensation and strength of LLE Psychological: alert and cooperative; normal mood and affect  Imaging: DG Ankle Complete Left  Result Date: 07/21/2022 CLINICAL DATA:  Fall from standing.  Left ankle and foot injury. EXAM: LEFT ANKLE COMPLETE - 3+ VIEW; LEFT FOOT - COMPLETE 3+ VIEW COMPARISON:  None Available. FINDINGS: Left ankle: The ankle mortise is symmetric and intact. Small plantar and minimal posterior calcaneal heel spurs. Mild dorsal tarsometatarsal degenerative osteophytes. No acute fracture or dislocation. Left foot: Type 2 os naviculare with associated mild degenerative change. There also appears to be mild curvilinear lucency overlying the ossicle, presumably chronic. Recommend correlation for point tenderness at the posterior tibial insertion on the medial navicular. Joint spaces are preserved.  No acute fracture or dislocation. IMPRESSION: Type 2 os naviculare (normal variant) with associated mild degenerative change across the synchondrosis. There also appears to be mild curvilinear lucency overlying the associated ossicle, presumably chronic. Recommend correlation for point tenderness at the posterior tibial insertion on the medial navicular. Otherwise, no acute fracture is seen  within left ankle or left foot.  Electronically Signed   By: Yvonne Kendall M.D.   On: 07/21/2022 16:20   DG Foot Complete Left  Result Date: 07/21/2022 CLINICAL DATA:  Fall from standing.  Left ankle and foot injury. EXAM: LEFT ANKLE COMPLETE - 3+ VIEW; LEFT FOOT - COMPLETE 3+ VIEW COMPARISON:  None Available. FINDINGS: Left ankle: The ankle mortise is symmetric and intact. Small plantar and minimal posterior calcaneal heel spurs. Mild dorsal tarsometatarsal degenerative osteophytes. No acute fracture or dislocation. Left foot: Type 2 os naviculare with associated mild degenerative change. There also appears to be mild curvilinear lucency overlying the ossicle, presumably chronic. Recommend correlation for point tenderness at the posterior tibial insertion on the medial navicular. Joint spaces are preserved.  No acute fracture or dislocation. IMPRESSION: Type 2 os naviculare (normal variant) with associated mild degenerative change across the synchondrosis. There also appears to be mild curvilinear lucency overlying the associated ossicle, presumably chronic. Recommend correlation for point tenderness at the posterior tibial insertion on the medial navicular. Otherwise, no acute fracture is seen within left ankle or left foot. Electronically Signed   By: Yvonne Kendall M.D.   On: 07/21/2022 16:20      No Known Allergies  Past Medical History:  Diagnosis Date   Gallstones    Hypertension    UTI (lower urinary tract infection)    Social History   Socioeconomic History   Marital status: Married    Spouse name: Not on file   Number of children: Not on file   Years of education: Not on file   Highest education level: Not on file  Occupational History   Not on file  Tobacco Use   Smoking status: Some Days    Packs/day: 0.50    Types: Cigarettes   Smokeless tobacco: Never  Vaping Use   Vaping Use: Never used  Substance and Sexual Activity   Alcohol use: Yes    Comment: heavy daily drinker   Drug use: Yes    Types:  Cocaine    Comment: hx "all of them"   Sexual activity: Not Currently    Comment: x 1 month  Other Topics Concern   Not on file  Social History Narrative   Not on file   Social Determinants of Health   Financial Resource Strain: Not on file  Food Insecurity: Not on file  Transportation Needs: Not on file  Physical Activity: Not on file  Stress: Not on file  Social Connections: Not on file   Family History  Problem Relation Age of Onset   Multiple sclerosis Mother    Past Surgical History:  Procedure Laterality Date   CHOLECYSTECTOMY  10/31/2011   Procedure: LAPAROSCOPIC CHOLECYSTECTOMY WITH INTRAOPERATIVE CHOLANGIOGRAM;  Surgeon: Imogene Burn. Georgette Dover, MD;  Location: [facility];  Service: General;  Laterality: N/A;   gallstones

## 2022-08-05 ENCOUNTER — Ambulatory Visit
Admission: EM | Admit: 2022-08-05 | Discharge: 2022-08-05 | Disposition: A | Discharge status: Home / Self Care | Payer: Self-pay | Attending: Urgent Care | Admitting: Urgent Care

## 2022-08-05 DIAGNOSIS — N760 Acute vaginitis: Secondary | ICD-10-CM | POA: Insufficient documentation

## 2022-08-05 LAB — POCT URINE PREGNANCY: Preg Test, Ur: NEGATIVE

## 2022-08-05 MED ORDER — FLUCONAZOLE 150 MG PO TABS: 150.0000 mg | ORAL_TABLET | ORAL | 0 refills | Status: AC

## 2022-08-05 NOTE — ED Provider Notes (Signed)
[facility]  Note:  This document was prepared using Systems analyst and may include unintentional dictation errors.  MRN: [id number] DOB: October 27, 1985  Subjective:   Amanda Nunez is a 37 y.o. female presenting for 3-day history of vaginal itching, burning and redness, soreness over the right upper side.  Patient is in a monogamous relationship, is attempting pregnancy currently.  LMP was 07/21/2022 but is not opposed to an urine pregnancy test.  She is also not opposed to STI testing although she feels comfortable she will not have an STI.  Denies fever, n/v, abdominal pain, pelvic pain, rashes, dysuria, urinary frequency, hematuria, vaginal discharge.    No current facility-administered medications for this encounter.  Current Outpatient Medications:    aspirin-acetaminophen-caffeine (EXCEDRIN MIGRAINE) 250-250-65 MG tablet, Take 2 tablets by mouth every 6 (six) hours as needed for headache., Disp: , Rfl:    No Known Allergies  Past Medical History:  Diagnosis Date   Gallstones    Hypertension    UTI (lower urinary tract infection)      Past Surgical History:  Procedure Laterality Date   CHOLECYSTECTOMY  10/31/2011   Procedure: LAPAROSCOPIC CHOLECYSTECTOMY WITH INTRAOPERATIVE CHOLANGIOGRAM;  Surgeon: Imogene Burn. Tsuei, MD;  Location: [facility];  Service: General;  Laterality: N/A;   gallstones      Family History  Problem Relation Age of Onset   Multiple sclerosis Mother     Social History   Tobacco Use   Smoking status: Some Days    Packs/day: .5    Types: Cigarettes   Smokeless tobacco: Never  Vaping Use   Vaping Use: Never used  Substance Use Topics   Alcohol use: Yes    Comment: heavy daily drinker   Drug use: Yes    Types: Cocaine    Comment: hx "all of them"    ROS   Objective:   Vitals: BP (!) 127/90 (BP Location: Left Arm)   Pulse 78   Temp 98.7 F (37.1 C) (Oral)   Resp 18   LMP 07/21/2022   SpO2 98%    Physical Exam Exam conducted with a chaperone present (CMA Demetrice).  Constitutional:      General: She is not in acute distress.    Appearance: Normal appearance. She is well-developed. She is not ill-appearing, toxic-appearing or diaphoretic.  HENT:     Head: Normocephalic and atraumatic.     Nose: Nose normal.     Mouth/Throat:     Mouth: Mucous membranes are moist.  Eyes:     General: No scleral icterus.       Right eye: No discharge.        Left eye: No discharge.     Extraocular Movements: Extraocular movements intact.  Cardiovascular:     Rate and Rhythm: Normal rate.  Pulmonary:     Effort: Pulmonary effort is normal.  Genitourinary:   Skin:    General: Skin is warm and dry.  Neurological:     General: No focal deficit present.     Mental Status: She is alert and oriented to person, place, and time.  Psychiatric:        Mood and Affect: Mood normal.        Behavior: Behavior normal.     Results for orders placed or performed during the hospital encounter of 08/05/22 (from the past 24 hour(s))  POCT urine pregnancy     Status: None   Collection Time: 08/05/22  1:26 PM  Result Value Ref Range   Preg Test, Ur Negative Negative   Assessment and Plan :   PDMP not reviewed this encounter.  1. Acute vaginitis     We will treat patient empirically for yeast vaginitis with fluconazole.  Labs pending. Patient declined blood work. No signs of herpes simplex, chancre, abscess. Counseled patient on potential for adverse effects with medications prescribed/recommended today, ER and return-to-clinic precautions discussed, patient verbalized understanding.    Jaynee Eagles, [place] 08/05/22 1341

## 2022-08-05 NOTE — ED Triage Notes (Signed)
Pt presents for vaginal burning and itching x 3 days.

## 2022-08-06 LAB — CERVICOVAGINAL ANCILLARY ONLY
Bacterial Vaginitis (gardnerella): POSITIVE — AB
Candida Glabrata: NEGATIVE
Candida Vaginitis: POSITIVE — AB
Chlamydia: NEGATIVE
Comment: NEGATIVE
Comment: NEGATIVE
Comment: NEGATIVE
Comment: NEGATIVE
Comment: NEGATIVE
Comment: NORMAL
Neisseria Gonorrhea: NEGATIVE
Trichomonas: NEGATIVE

## 2022-08-07 ENCOUNTER — Telehealth: Payer: Self-pay

## 2022-08-07 ENCOUNTER — Telehealth (HOSPITAL_COMMUNITY): Payer: Self-pay | Admitting: Emergency Medicine

## 2022-08-07 MED ORDER — METRONIDAZOLE 500 MG PO TABS: 500.0000 mg | ORAL_TABLET | Freq: Two times a day (BID) | ORAL | 0 refills | Status: AC

## 2022-08-07 NOTE — Telephone Encounter (Signed)
Patient verification complete (name and date of birth).   Patient called stating she has noticed an increase in bumps to vaginal region and states she has not yet began taking her flagyl. Patient made aware that she should begin her prescribed medication and that she is more than welcome to come back for re-evaluation of the bumps/lesions per provider. All questions answered.

## 2022-08-09 ENCOUNTER — Ambulatory Visit
Admission: RE | Admit: 2022-08-09 | Discharge: 2022-08-09 | Disposition: A | Discharge status: Home / Self Care | Payer: Self-pay | Source: Ambulatory Visit | Attending: Family Medicine | Admitting: Family Medicine

## 2022-08-09 VITALS — BP 136/84 | HR 68 | Temp 98.0°F | Resp 18

## 2022-08-09 DIAGNOSIS — N766 Ulceration of vulva: Secondary | ICD-10-CM | POA: Insufficient documentation

## 2022-08-09 MED ORDER — VALACYCLOVIR HCL 1 G PO TABS: 1000.0000 mg | ORAL_TABLET | Freq: Two times a day (BID) | ORAL | 1 refills | Status: AC

## 2022-08-09 NOTE — ED Provider Notes (Signed)
[facility]    CSN: [id number] Arrival date & time: 08/09/22  1358      History   Chief Complaint Chief Complaint  Patient presents with   SEXUALLY TRANSMITTED DISEASE    Entered by patient    HPI Amanda Nunez is a 37 y.o. female.   HPI Here for sores and vaginal discharge.  She was seen here on March 20.  At that time exam was not consistent with any herpetiform lesion.  Vaginal swab was done and she tested positive for BV and for yeast.  She had been treated empirically with Diflucan, and then Flagyl was sent in.  She notes that on March 21 began feeling like the problem was worsening, and she has more white spots and more swelling and discomfort on the perineum.  Last menstrual cycle was earlier this month.  Past Medical History:  Diagnosis Date   Gallstones    Hypertension    UTI (lower urinary tract infection)     There are no problems to display for this patient.   Past Surgical History:  Procedure Laterality Date   CHOLECYSTECTOMY  10/31/2011   Procedure: LAPAROSCOPIC CHOLECYSTECTOMY WITH INTRAOPERATIVE CHOLANGIOGRAM;  Surgeon: Imogene Burn. Georgette Dover, MD;  Location: [facility];  Service: General;  Laterality: N/A;   gallstones      OB History     Gravida  2   Para  2   Term      Preterm      AB      Living         SAB      IAB      Ectopic      Multiple      Live Births               Home Medications    Prior to Admission medications   Medication Sig Start Date End Date Taking? Authorizing Provider  valACYclovir (VALTREX) 1000 MG tablet Take 1 tablet (1,000 mg total) by mouth 2 (two) times daily for 7 days. 08/09/22 08/16/22 Yes Barrett Henle, MD  aspirin-acetaminophen-caffeine (EXCEDRIN MIGRAINE) [phone] MG tablet Take 2 tablets by mouth every 6 (six) hours as needed for headache.    [provider]  fluconazole (DIFLUCAN) 150 MG tablet Take 1 tablet (150 mg total) by mouth every 3 (three) days. 08/05/22   Jaynee Eagles, PA-C  metroNIDAZOLE (FLAGYL) 500 MG tablet Take 1 tablet (500 mg total) by mouth 2 (two) times daily. 08/07/22   Chase Picket, MD  levonorgestrel (MIRENA) 20 MCG/24HR IUD 1 each by Intrauterine route once.  04/15/19  [provider]  Norethindrone Acetate-Ethinyl Estrad-FE (LOESTRIN 24 FE) 1-20 MG-MCG(24) tablet Take 1 tablet by mouth daily. 11/24/12 04/15/19  Shelly Bombard, MD    Family History Family History  Problem Relation Age of Onset   Multiple sclerosis Mother     Social History Social History   Tobacco Use   Smoking status: Some Days    Packs/day: .5    Types: Cigarettes   Smokeless tobacco: Never  Vaping Use   Vaping Use: Never used  Substance Use Topics   Alcohol use: Yes    Comment: heavy daily drinker   Drug use: Yes    Types: Cocaine    Comment: hx "all of them"     Allergies   Patient has no known allergies.   Review of Systems Review of Systems   Physical Exam Triage Vital Signs ED Triage Vitals  Enc Vitals Group     BP 08/09/22 1412 136/84     Pulse Rate 08/09/22 1412 68     Resp 08/09/22 1412 18     Temp 08/09/22 1412 98 F (36.7 C)     Temp Source 08/09/22 1412 Oral     SpO2 08/09/22 1412 99 %     Weight --      Height --      Head Circumference --      Peak Flow --      Pain Score 08/09/22 1411 8     Pain Loc --      Pain Edu? --      Excl. in [place]? --    No data found.  Updated Vital Signs BP 136/84 (BP Location: Left Arm)   Pulse 68   Temp 98 F (36.7 C) (Oral)   Resp 18   LMP 07/21/2022   SpO2 99%   Visual Acuity Right Eye Distance:   Left Eye Distance:   Bilateral Distance:    Right Eye Near:   Left Eye Near:    Bilateral Near:     Physical Exam Vitals reviewed.  Constitutional:      General: She is not in acute distress.    Appearance: She is not ill-appearing, toxic-appearing or diaphoretic.  Genitourinary:    Comments: At present there is no erythema or induration or swelling of the  labia majora or minora.  On the medial aspect of the labia majora there is an ulcerative lesion about 2 or 3 mm in diameter.  Herpes testing swab is done at that lesion.  She is very tender when I do that swab collection.  There is no purulent drainage. Neurological:     General: No focal deficit present.     Mental Status: She is alert and oriented to person, place, and time.  Psychiatric:        Behavior: Behavior normal.      UC Treatments / Results  Labs (all labs ordered are listed, but only abnormal results are displayed) Labs Reviewed  HSV CULTURE AND TYPING    EKG   Radiology No results found.  Procedures Procedures (including critical care time)  Medications Ordered in UC Medications - No data to display  Initial Impression / Assessment and Plan / UC Course  I have reviewed the triage vital signs and the nursing notes.  Pertinent labs & imaging results that were available during my care of the patient were reviewed by me and considered in my medical decision making (see chart for details).       I discussed that this is typical appearance for herpes infection.  Valtrex is sent in in case this is still within the 72 hours, since she feels like the lesions appeared on March 21.  Herpes culture swab is sent to test the vesicular lesion I saw  Final Clinical Impressions(s) / UC Diagnoses   Final diagnoses:  Genital ulcer, female     Discharge Instructions      Take valacyclovir 1000 mg--1 tablet 2 times daily for 7 days  Testing is sent of the rash; staff will notify you if anything is positive      ED Prescriptions     Medication Sig Dispense Auth. Provider   valACYclovir (VALTREX) 1000 MG tablet Take 1 tablet (1,000 mg total) by mouth 2 (two) times daily for 7 days. 14 tablet Naryiah Schley, Gwenlyn Perking, MD      PDMP not reviewed this  encounter.   Barrett Henle, MD 08/09/22 [phone]

## 2022-08-09 NOTE — Discharge Instructions (Addendum)
Take valacyclovir 1000 mg--1 tablet 2 times daily for 7 days  Testing is sent of the rash; staff will notify you if anything is positive

## 2022-08-09 NOTE — ED Triage Notes (Addendum)
Pt c/o lesions to vagina that are white and painful. Patient is still taking prescribed abx.  Home interventions: Ibuprofen, abd ointment

## 2022-08-12 LAB — HSV CULTURE AND TYPING

## 2022-11-11 ENCOUNTER — Telehealth: Payer: Self-pay

## 2022-11-11 NOTE — Telephone Encounter (Signed)
Patient called and stated she called the pharmacy and they stated she didn't have any refill on her script from 08/09/2022, so patient is calling due to she is having another "outbreak" and requesting a refill. I informed the patient we don't refill medications she would need to be seen again for a refill.

## 2023-09-12 ENCOUNTER — Ambulatory Visit: Payer: Self-pay

## 2024-01-24 ENCOUNTER — Encounter (HOSPITAL_COMMUNITY): Payer: Self-pay

## 2024-01-24 ENCOUNTER — Ambulatory Visit (HOSPITAL_COMMUNITY)
Admission: RE | Admit: 2024-01-24 | Discharge: 2024-01-24 | Disposition: A | Discharge status: Home / Self Care | Payer: Self-pay | Source: Ambulatory Visit | Attending: Physician Assistant | Admitting: Physician Assistant

## 2024-01-24 VITALS — BP 130/81 | HR 72 | Temp 97.7°F | Resp 16

## 2024-01-24 DIAGNOSIS — B009 Herpesviral infection, unspecified: Secondary | ICD-10-CM | POA: Insufficient documentation

## 2024-01-24 DIAGNOSIS — F17210 Nicotine dependence, cigarettes, uncomplicated: Secondary | ICD-10-CM | POA: Insufficient documentation

## 2024-01-24 DIAGNOSIS — I10 Essential (primary) hypertension: Secondary | ICD-10-CM | POA: Insufficient documentation

## 2024-01-24 DIAGNOSIS — Z113 Encounter for screening for infections with a predominantly sexual mode of transmission: Secondary | ICD-10-CM | POA: Insufficient documentation

## 2024-01-24 DIAGNOSIS — Z760 Encounter for issue of repeat prescription: Secondary | ICD-10-CM | POA: Insufficient documentation

## 2024-01-24 DIAGNOSIS — Z711 Person with feared health complaint in whom no diagnosis is made: Secondary | ICD-10-CM

## 2024-01-24 DIAGNOSIS — R21 Rash and other nonspecific skin eruption: Secondary | ICD-10-CM | POA: Insufficient documentation

## 2024-01-24 DIAGNOSIS — Z79899 Other long term (current) drug therapy: Secondary | ICD-10-CM | POA: Insufficient documentation

## 2024-01-24 HISTORY — DX: Herpesviral infection, unspecified: B00.9

## 2024-01-24 LAB — HIV ANTIBODY (ROUTINE TESTING W REFLEX): HIV Screen 4th Generation wRfx: NONREACTIVE

## 2024-01-24 MED ORDER — VALACYCLOVIR HCL 1 G PO TABS: 1000.0000 mg | ORAL_TABLET | Freq: Two times a day (BID) | ORAL | 2 refills | Status: AC

## 2024-01-24 MED ORDER — AMLODIPINE BESYLATE 5 MG PO TABS: 5.0000 mg | ORAL_TABLET | Freq: Every day | ORAL | 1 refills | Status: AC

## 2024-01-24 NOTE — ED Triage Notes (Signed)
 Pt reports that she has HSV 1 and needing refill medication. Pt also needs HTN medication refilled as well, believes Amlodipine . Pt doesn't have PCP.  Pt also requesting STD testing. Reports having s/s of HSV, denies any known exposure.

## 2024-01-24 NOTE — ED Provider Notes (Signed)
 [facility]    CSN: [id number] Arrival date & time: 01/24/24  1430      History   Chief Complaint Chief Complaint  Patient presents with   appt 230p    HPI Amanda Nunez is a 38 y.o. female.   Patient complains of multiple medical problems.  Patient reports she is currently having an outbreak of genital herpes.  Patient is requesting valac   Patient also asked if she can get a refill of her blood pressure medicine as she is currently out of her blood pressure medicine.  Patient would like to have testing for STDs.   Past Medical History:  Diagnosis Date   Gallstones    HSV-1 infection    Hypertension    UTI (lower urinary tract infection)     There are no active problems to display for this patient.   Past Surgical History:  Procedure Laterality Date   CHOLECYSTECTOMY  10/31/2011   Procedure: LAPAROSCOPIC CHOLECYSTECTOMY WITH INTRAOPERATIVE CHOLANGIOGRAM;  Surgeon: Donnice MARLA. Belinda, MD;  Location: [facility];  Service: General;  Laterality: N/A;   gallstones      OB History     Gravida  2   Para  2   Term      Preterm      AB      Living         SAB      IAB      Ectopic      Multiple      Live Births               Home Medications    Prior to Admission medications   Medication Sig Start Date End Date Taking? Authorizing Provider  amLODipine  (NORVASC ) 5 MG tablet Take 1 tablet (5 mg total) by mouth daily. 01/24/24 01/23/25 Yes Nayellie Sanseverino K, PA-C  valACYclovir  (VALTREX ) 1000 MG tablet Take 1 tablet (1,000 mg total) by mouth 2 (two) times daily. 01/24/24  Yes Deseray Daponte K, PA-C  aspirin-acetaminophen -caffeine (EXCEDRIN MIGRAINE) 250-250-65 MG tablet Take 2 tablets by mouth every 6 (six) hours as needed for headache.    [provider]  fluconazole  (DIFLUCAN ) 150 MG tablet Take 1 tablet (150 mg total) by mouth every 3 (three) days. 08/05/22   Christopher Savannah, PA-C  metroNIDAZOLE  (FLAGYL ) 500 MG tablet Take 1 tablet (500 mg total)  by mouth 2 (two) times daily. 08/07/22   Blaise Aleene KIDD, MD  levonorgestrel (MIRENA) 20 MCG/24HR IUD 1 each by Intrauterine route once.  04/15/19  [provider]  Norethindrone Acetate-Ethinyl Estrad-FE (LOESTRIN 24 FE) 1-20 MG-MCG(24) tablet Take 1 tablet by mouth daily. 11/24/12 04/15/19  Rudy Carlin LABOR, MD    Family History Family History  Problem Relation Age of Onset   Multiple sclerosis Mother     Social History Social History   Tobacco Use   Smoking status: Some Days    Current packs/day: 0.50    Types: Cigarettes   Smokeless tobacco: Never  Vaping Use   Vaping status: Never Used  Substance Use Topics   Alcohol use: Yes    Comment: heavy daily drinker   Drug use: Yes    Types: Cocaine    Comment: hx all of them     Allergies   Patient has no known allergies.   Review of Systems Review of Systems  All other systems reviewed and are negative.    Physical Exam Triage Vital Signs ED Triage Vitals  Encounter Vitals Group  BP 01/24/24 1458 130/81     Girls Systolic BP Percentile --      Girls Diastolic BP Percentile --      Boys Systolic BP Percentile --      Boys Diastolic BP Percentile --      Pulse Rate 01/24/24 1458 72     Resp 01/24/24 1458 16     Temp 01/24/24 1458 97.7 F (36.5 C)     Temp Source 01/24/24 1458 Oral     SpO2 01/24/24 1458 100 %     Weight --      Height --      Head Circumference --      Peak Flow --      Pain Score 01/24/24 1457 0     Pain Loc --      Pain Education --      Exclude from Growth Chart --    No data found.  Updated Vital Signs BP 130/81 (BP Location: Left Arm)   Pulse 72   Temp 97.7 F (36.5 C) (Oral)   Resp 16   LMP 01/10/2024 (Approximate)   SpO2 100%   Visual Acuity Right Eye Distance:   Left Eye Distance:   Bilateral Distance:    Right Eye Near:   Left Eye Near:    Bilateral Near:     Physical Exam Vitals and nursing note reviewed.  Constitutional:      Appearance: She  is well-developed.  HENT:     Head: Normocephalic.  Eyes:     Pupils: Pupils are equal, round, and reactive to light.  Cardiovascular:     Rate and Rhythm: Normal rate.  Pulmonary:     Effort: Pulmonary effort is normal.  Abdominal:     General: There is no distension.  Musculoskeletal:        General: Normal range of motion.     Cervical back: Normal range of motion.  Skin:    General: Skin is warm.  Neurological:     General: No focal deficit present.     Mental Status: She is alert and oriented to person, place, and time.  Psychiatric:        Mood and Affect: Mood normal.      UC Treatments / Results  Labs (all labs ordered are listed, but only abnormal results are displayed) Labs Reviewed  HIV ANTIBODY (ROUTINE TESTING W REFLEX)  RPR  CERVICOVAGINAL ANCILLARY ONLY    EKG   Radiology No results found.  Procedures Procedures (including critical care time)  Medications Ordered in UC Medications - No data to display  Initial Impression / Assessment and Plan / UC Course  I have reviewed the triage vital signs and the nursing notes.  Pertinent labs & imaging results that were available during my care of the patient were reviewed by me and considered in my medical decision making (see chart for details).     Patient given a prescription for Valtrex  and Norvasc .  STD testing is pending.  Patient is advised to follow-up with her primary care physician for recheck. Final Clinical Impressions(s) / UC Diagnoses   Final diagnoses:  Herpes  Concern about STD in female without diagnosis  Rash and nonspecific skin eruption     Discharge Instructions      Return if any problems    ED Prescriptions     Medication Sig Dispense Auth. Provider   valACYclovir  (VALTREX ) 1000 MG tablet Take 1 tablet (1,000 mg total) by mouth 2 (two) times  daily. 14 tablet Liliana Brentlinger K, PA-C   amLODipine  (NORVASC ) 5 MG tablet Take 1 tablet (5 mg total) by mouth daily. 90 tablet  Setsuko Robins K, PA-C     An After Visit Summary was printed and given to the patient.     PDMP not reviewed this encounter.   Flint Sonny POUR, PA-C 01/24/24 2054

## 2024-01-24 NOTE — Discharge Instructions (Signed)
 Return if any problems.

## 2024-01-25 ENCOUNTER — Ambulatory Visit (HOSPITAL_COMMUNITY): Payer: Self-pay

## 2024-01-25 LAB — CERVICOVAGINAL ANCILLARY ONLY
Bacterial Vaginitis (gardnerella): POSITIVE — AB
Candida Glabrata: NEGATIVE
Candida Vaginitis: NEGATIVE
Chlamydia: NEGATIVE
Comment: NEGATIVE
Comment: NEGATIVE
Comment: NEGATIVE
Comment: NEGATIVE
Comment: NEGATIVE
Comment: NORMAL
Neisseria Gonorrhea: NEGATIVE
Trichomonas: NEGATIVE

## 2024-01-25 LAB — RPR: RPR Ser Ql: NONREACTIVE

## 2024-01-25 MED ORDER — METRONIDAZOLE 500 MG PO TABS: 500.0000 mg | ORAL_TABLET | Freq: Two times a day (BID) | ORAL | 0 refills | Status: AC

## 2024-06-22 ENCOUNTER — Telehealth: Payer: Self-pay | Admitting: Emergency Medicine

## 2024-06-22 DIAGNOSIS — R3989 Other symptoms and signs involving the genitourinary system: Secondary | ICD-10-CM

## 2024-06-22 MED ORDER — CEPHALEXIN 500 MG PO CAPS: 500.0000 mg | ORAL_CAPSULE | Freq: Two times a day (BID) | ORAL | 0 refills | Status: AC

## 2024-06-22 NOTE — Progress Notes (Signed)
